# Patient Record
Sex: Male | Born: 1985 | Race: Black or African American | Hispanic: No | Marital: Single | Smoking: Former smoker
Health system: Southern US, Community
[De-identification: ages and names within clinical notes are randomized; demographics above are authoritative.]

## PROBLEM LIST (undated history)

## (undated) DIAGNOSIS — G43909 Migraine, unspecified, not intractable, without status migrainosus: Secondary | ICD-10-CM

## (undated) DIAGNOSIS — J189 Pneumonia, unspecified organism: Secondary | ICD-10-CM

---
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
## 2006-04-21 ENCOUNTER — Emergency Department (HOSPITAL_COMMUNITY): Admission: EM | Admit: 2006-04-21 | Discharge: 2006-04-21 | Payer: Self-pay | Admitting: Family Medicine

## 2009-01-27 ENCOUNTER — Emergency Department (HOSPITAL_COMMUNITY): Admission: EM | Admit: 2009-01-27 | Discharge: 2009-01-27 | Payer: Self-pay | Admitting: Emergency Medicine

## 2009-01-29 ENCOUNTER — Emergency Department (HOSPITAL_COMMUNITY): Admission: EM | Admit: 2009-01-29 | Discharge: 2009-01-29 | Payer: Self-pay | Admitting: Emergency Medicine

## 2009-02-15 ENCOUNTER — Emergency Department (HOSPITAL_COMMUNITY): Admission: EM | Admit: 2009-02-15 | Discharge: 2009-02-15 | Payer: Self-pay | Admitting: Emergency Medicine

## 2010-03-11 ENCOUNTER — Emergency Department (HOSPITAL_COMMUNITY): Admission: EM | Admit: 2010-03-11 | Discharge: 2010-03-11 | Payer: Self-pay | Admitting: Emergency Medicine

## 2010-12-13 ENCOUNTER — Emergency Department (HOSPITAL_COMMUNITY)
Admission: EM | Admit: 2010-12-13 | Discharge: 2010-12-13 | Payer: Self-pay | Source: Home / Self Care | Admitting: Emergency Medicine

## 2010-12-14 ENCOUNTER — Emergency Department (HOSPITAL_COMMUNITY)
Admission: EM | Admit: 2010-12-14 | Discharge: 2010-12-14 | Payer: Self-pay | Source: Home / Self Care | Admitting: Emergency Medicine

## 2010-12-25 ENCOUNTER — Emergency Department (HOSPITAL_COMMUNITY)
Admission: EM | Admit: 2010-12-25 | Discharge: 2010-12-25 | Disposition: A | Discharge status: Home / Self Care | Payer: Self-pay | Attending: Emergency Medicine | Admitting: Emergency Medicine

## 2010-12-25 DIAGNOSIS — R454 Irritability and anger: Secondary | ICD-10-CM | POA: Insufficient documentation

## 2010-12-25 DIAGNOSIS — I498 Other specified cardiac arrhythmias: Secondary | ICD-10-CM | POA: Insufficient documentation

## 2010-12-25 DIAGNOSIS — G43909 Migraine, unspecified, not intractable, without status migrainosus: Secondary | ICD-10-CM | POA: Insufficient documentation

## 2010-12-25 DIAGNOSIS — R259 Unspecified abnormal involuntary movements: Secondary | ICD-10-CM | POA: Insufficient documentation

## 2011-03-09 ENCOUNTER — Emergency Department (HOSPITAL_COMMUNITY)
Admission: EM | Admit: 2011-03-09 | Discharge: 2011-03-09 | Disposition: A | Discharge status: Home / Self Care | Payer: No Typology Code available for payment source | Attending: Emergency Medicine | Admitting: Emergency Medicine

## 2011-03-09 DIAGNOSIS — S335XXA Sprain of ligaments of lumbar spine, initial encounter: Secondary | ICD-10-CM | POA: Insufficient documentation

## 2011-03-09 DIAGNOSIS — Y9289 Other specified places as the place of occurrence of the external cause: Secondary | ICD-10-CM | POA: Insufficient documentation

## 2011-11-23 ENCOUNTER — Encounter: Payer: Self-pay | Admitting: Emergency Medicine

## 2011-11-23 ENCOUNTER — Emergency Department (HOSPITAL_COMMUNITY)
Admission: EM | Admit: 2011-11-23 | Discharge: 2011-11-23 | Discharge status: Against Medical Advice | Payer: Self-pay | Attending: Emergency Medicine | Admitting: Emergency Medicine

## 2011-11-23 DIAGNOSIS — M79609 Pain in unspecified limb: Secondary | ICD-10-CM | POA: Insufficient documentation

## 2011-11-23 NOTE — ED Notes (Signed)
Pt c/o left hand pain after horse playing yesterday; pt sts painful to move

## 2012-06-13 ENCOUNTER — Encounter (HOSPITAL_COMMUNITY): Payer: Self-pay | Admitting: *Deleted

## 2012-06-13 ENCOUNTER — Emergency Department (HOSPITAL_COMMUNITY)
Admission: EM | Admit: 2012-06-13 | Discharge: 2012-06-13 | Disposition: A | Discharge status: Home / Self Care | Payer: Self-pay | Attending: Emergency Medicine | Admitting: Emergency Medicine

## 2012-06-13 DIAGNOSIS — J3489 Other specified disorders of nose and nasal sinuses: Secondary | ICD-10-CM | POA: Insufficient documentation

## 2012-06-13 HISTORY — DX: Migraine, unspecified, not intractable, without status migrainosus: G43.909

## 2012-06-13 NOTE — ED Notes (Signed)
Pt is here with sinus problems with icepack to head and state that he gets sick on his stomach. Pt reports history of migraines and wants a dark room.  Pt reports hurting for couple of hours

## 2012-06-13 NOTE — ED Notes (Signed)
Called and no response times 2

## 2012-06-13 NOTE — ED Notes (Signed)
Pt did not answer x 3 

## 2013-03-24 ENCOUNTER — Emergency Department (HOSPITAL_COMMUNITY)
Admission: EM | Admit: 2013-03-24 | Discharge: 2013-03-24 | Disposition: A | Discharge status: Home / Self Care | Payer: Self-pay | Attending: Emergency Medicine | Admitting: Emergency Medicine

## 2013-03-24 ENCOUNTER — Encounter (HOSPITAL_COMMUNITY): Payer: Self-pay | Admitting: *Deleted

## 2013-03-24 DIAGNOSIS — R51 Headache: Secondary | ICD-10-CM | POA: Insufficient documentation

## 2013-03-24 DIAGNOSIS — F17200 Nicotine dependence, unspecified, uncomplicated: Secondary | ICD-10-CM | POA: Insufficient documentation

## 2013-03-24 MED ORDER — METOCLOPRAMIDE HCL 5 MG/ML IJ SOLN
10.0000 mg | Freq: Once | INTRAMUSCULAR | Status: AC
  Administered 2013-03-24: 10 mg via INTRAVENOUS
  Filled 2013-03-24: qty 2

## 2013-03-24 MED ORDER — LORAZEPAM 2 MG/ML IJ SOLN
1.0000 mg | Freq: Once | INTRAMUSCULAR | Status: AC
  Administered 2013-03-24: 1 mg via INTRAVENOUS
  Filled 2013-03-24: qty 1

## 2013-03-24 MED ORDER — DIPHENHYDRAMINE HCL 50 MG/ML IJ SOLN
25.0000 mg | Freq: Once | INTRAMUSCULAR | Status: AC
  Administered 2013-03-24: 25 mg via INTRAVENOUS

## 2013-03-24 MED ORDER — DIPHENHYDRAMINE HCL 50 MG/ML IJ SOLN
INTRAMUSCULAR | Status: AC
  Filled 2013-03-24: qty 1

## 2013-03-24 MED ORDER — KETOROLAC TROMETHAMINE 30 MG/ML IJ SOLN
30.0000 mg | Freq: Once | INTRAMUSCULAR | Status: AC
  Administered 2013-03-24: 30 mg via INTRAVENOUS
  Filled 2013-03-24: qty 1

## 2013-03-24 MED ORDER — DIPHENHYDRAMINE HCL 50 MG/ML IJ SOLN
25.0000 mg | Freq: Once | INTRAMUSCULAR | Status: AC
  Administered 2013-03-24: 25 mg via INTRAVENOUS
  Filled 2013-03-24: qty 1

## 2013-03-24 MED ORDER — SODIUM CHLORIDE 0.9 % IV BOLUS (SEPSIS)
1000.0000 mL | Freq: Once | INTRAVENOUS | Status: AC
  Administered 2013-03-24: 1000 mL via INTRAVENOUS

## 2013-03-24 MED ORDER — HYDROCODONE-ACETAMINOPHEN 5-325 MG PO TABS: 1.0000 | ORAL_TABLET | Freq: Four times a day (QID) | ORAL | Status: DC | PRN

## 2013-03-24 NOTE — ED Provider Notes (Signed)
History     CSN: [id number]  Arrival date & time 03/24/13  1633   First MD Initiated Contact with Patient 03/24/13 1853      Chief Complaint  Patient presents with  . Headache    (Consider location/radiation/quality/duration/timing/severity/associated sxs/prior treatment) The history is provided by the patient and a relative. No language interpreter was used.    Frank Rangel is a 27 y.o. male who complains of migraine headache for 1 day(s). He has a well established history of recurrent migraines. Description of pain: throbbing pain, UL on the right, associated sinus pain and pressure. Associated symptoms: congestion, dizziness, facial pain, light sensitivity, nausea, vomiting, sound sensitivity, and severe sinus pressure, blurry vision and pain. Patient's headaches recur and become worse with change of seasons. Patient has already taken tylenol for this headache without relief. Patient has been having these headaches since middle school. He has had several CT scans at the ED and all have been negative. He has never seen a neurologist or had lumbar puncture.  Denies stiff neck, neck pain, rash, or "thunderclap" onset.    Current Facility-Administered Medications  Medication Dose Route Frequency Provider Last Rate Last Dose  . diphenhydrAMINE (BENADRYL) injection 25 mg  25 mg Intravenous Once [employer], PA-C      . ketorolac (TORADOL) 30 MG/ML injection 30 mg  30 mg Intravenous Once Arthor Captain, PA-C      . metoCLOPramide (REGLAN) injection 10 mg  10 mg Intravenous Once [employer], PA-C      . sodium chloride 0.9 % bolus 1,000 mL  1,000 mL Intravenous Once Arthor Captain, PA-C       No current outpatient prescriptions on file.    There are no associated abnormal neurological symptoms such as TIA's, loss of balance, loss of vision or speech, numbness or weakness on review. Past neurological history: negative for stroke, MS, epilepsy, or brain tumor.     Past  Medical History  Diagnosis Date  . Migraines     History reviewed. No pertinent past surgical history.  History reviewed. No pertinent family history.  History  Substance Use Topics  . Smoking status: Current Every Day Smoker  . Smokeless tobacco: Not on file  . Alcohol Use: No      Review of Systems Ten systems reviewed and are negative for acute change, except as noted in the HPI.   Allergies  Review of patient's allergies indicates no known allergies.  Home Medications  No current outpatient prescriptions on file.  BP 147/83  Pulse 48  Temp(Src) 97.9 F (36.6 C) (Oral)  Resp 20  SpO2 100%  Physical Exam Physical Exam  Nursing note and vitals reviewed. Constitutional: He appears well-developed and well-nourished. Wet towel over eyes. Patient appears extremely uncomfortable. HENT:  Head: Normocephalic and atraumatic.  Eyes: Conjunctivae normal are normal. No scleral icterus. EOMI/ PERRLA Neck: Normal range of motion. Neck supple.  Cardiovascular: Normal rate, regular rhythm and normal heart sounds.   Pulmonary/Chest: Effort normal and breath sounds normal. No respiratory distress.  Abdominal: Soft. There is no tenderness.  Musculoskeletal: He exhibits no edema.  Neurological: He is alert. Speech is clear and goal oriented, follows commands Major Cranial nerves without deficit, no facial droop Normal strength in upper and lower extremities bilaterally including dorsiflexion and plantar flexion, strong and equal grip strength Sensation normal to light and sharp touch Moves extremities without ataxia, coordination intact Normal finger to nose and rapid alternating movements Skin: Skin is warm and dry. He  is not diaphoretic.  Psychiatric: His behavior is normal.    ED Course  Procedures (including critical care time)  Labs Reviewed - No data to display No results found.   1. Headache       MDM  7:54 PM BP 147/83  Pulse 48  Temp(Src) 97.9 F (36.6  C) (Oral)  Resp 20  SpO2 100% Patient with typical headache. Will begin with migraine cocktail .   8:56 PM Pt HA treated and improved while in ED.  Presentation is like pts typical HA and non concerning for [facility], ICH, Meningitis, or temporal arteritis. Pt is afebrile with no focal neuro deficits, nuchal rigidity, or change in vision. Pt is to follow up with neurology for headache eval. . Pt verbalizes understanding and is agreeable with plan to dc.      Arthor Captain, PA-C 03/25/13 [phone]

## 2013-03-24 NOTE — ED Notes (Addendum)
Pt in with headache x1 day, history of same, also nausea, denies vomiting, pt states these symptoms are typical for his migraines

## 2013-03-27 ENCOUNTER — Encounter (HOSPITAL_COMMUNITY): Payer: Self-pay

## 2013-03-27 ENCOUNTER — Emergency Department (HOSPITAL_COMMUNITY)
Admission: EM | Admit: 2013-03-27 | Discharge: 2013-03-27 | Disposition: A | Discharge status: Home / Self Care | Payer: Self-pay | Attending: Emergency Medicine | Admitting: Emergency Medicine

## 2013-03-27 DIAGNOSIS — R112 Nausea with vomiting, unspecified: Secondary | ICD-10-CM | POA: Insufficient documentation

## 2013-03-27 DIAGNOSIS — F17200 Nicotine dependence, unspecified, uncomplicated: Secondary | ICD-10-CM | POA: Insufficient documentation

## 2013-03-27 DIAGNOSIS — G43909 Migraine, unspecified, not intractable, without status migrainosus: Secondary | ICD-10-CM | POA: Insufficient documentation

## 2013-03-27 MED ORDER — DIPHENHYDRAMINE HCL 50 MG/ML IJ SOLN
25.0000 mg | Freq: Once | INTRAMUSCULAR | Status: AC
  Administered 2013-03-27: 25 mg via INTRAVENOUS
  Filled 2013-03-27: qty 1

## 2013-03-27 MED ORDER — SODIUM CHLORIDE 0.9 % IV BOLUS (SEPSIS)
1000.0000 mL | Freq: Once | INTRAVENOUS | Status: AC
  Administered 2013-03-27: 1000 mL via INTRAVENOUS

## 2013-03-27 MED ORDER — DEXAMETHASONE SODIUM PHOSPHATE 10 MG/ML IJ SOLN
10.0000 mg | Freq: Once | INTRAMUSCULAR | Status: AC
  Administered 2013-03-27: 10 mg via INTRAVENOUS
  Filled 2013-03-27: qty 1

## 2013-03-27 MED ORDER — FEXOFENADINE-PSEUDOEPHED ER 60-120 MG PO TB12: 1.0000 | ORAL_TABLET | Freq: Two times a day (BID) | ORAL | Status: DC

## 2013-03-27 NOTE — ED Provider Notes (Signed)
History     CSN: [id number]  Arrival date & time 03/27/13  1628   First MD Initiated Contact with Patient 03/27/13 1727      Chief Complaint  Patient presents with  . Migraine    (Consider location/radiation/quality/duration/timing/severity/associated sxs/prior treatment) HPI Comments: Patient presents with a migraine-type headache. He has a history of recurrent migraines. He says they typically get worse as the weather changing. He states he has about 3-4 migraines a week. He was recently seen Saturday for the same thing. He states his headache started about 11:30 this morning and has gradually gotten worse since then. He describes it as a pain in the left side of his head radiating to his left neck. He states this is the same type of pain he always has with his migraines. He feels some pressure in her sinuses but denies any sinus congestion or drainage. Denies any fevers or chills. He had some nausea and vomiting earlier. He denies any vision changes. He took a Vicodin earlier for the pain without relief.  Patient is a 27 y.o. male presenting with migraines.  Migraine Associated symptoms include headaches. Pertinent negatives include no chest pain, no abdominal pain and no shortness of breath.    Past Medical History  Diagnosis Date  . Migraines     History reviewed. No pertinent past surgical history.  History reviewed. No pertinent family history.  History  Substance Use Topics  . Smoking status: Current Every Day Smoker  . Smokeless tobacco: Not on file  . Alcohol Use: No      Review of Systems  Constitutional: Negative for fever, chills, diaphoresis and fatigue.  HENT: Negative for congestion, rhinorrhea and sneezing.   Eyes: Negative.   Respiratory: Negative for cough, chest tightness and shortness of breath.   Cardiovascular: Negative for chest pain and leg swelling.  Gastrointestinal: Positive for nausea and vomiting. Negative for abdominal pain, diarrhea and  blood in stool.  Genitourinary: Negative for frequency, hematuria, flank pain and difficulty urinating.  Musculoskeletal: Negative for back pain and arthralgias.  Skin: Negative for rash.  Neurological: Positive for headaches. Negative for dizziness, speech difficulty, weakness and numbness.    Allergies  Reglan  Home Medications   Current Outpatient Rx  Name  Route  Sig  Dispense  Refill  . HYDROcodone-acetaminophen (NORCO) 5-325 MG per tablet   Oral   Take 1-2 tablets by mouth every 6 (six) hours as needed for pain.   20 tablet   0   . Pseudoephedrine-Naproxen Na (ALEVE-D SINUS & HEADACHE) 120-220 MG TB12   Oral   Take 1 tablet by mouth 2 (two) times daily as needed (headaches).         . fexofenadine-pseudoephedrine (ALLEGRA-D) 60-120 MG per tablet   Oral   Take 1 tablet by mouth every 12 (twelve) hours.   30 tablet   0     BP 107/69  Pulse 46  Temp(Src) 98.1 F (36.7 C) (Oral)  Resp 16  SpO2 100%  Physical Exam  Constitutional: He is oriented to person, place, and time. He appears well-developed and well-nourished.  HENT:  Head: Normocephalic and atraumatic.  Eyes: Pupils are equal, round, and reactive to light.  Neck: Normal range of motion. Neck supple.  No meningeal signs  Cardiovascular: Normal rate, regular rhythm and normal heart sounds.   Pulmonary/Chest: Effort normal and breath sounds normal. No respiratory distress. He has no wheezes. He has no rales. He exhibits no tenderness.  Abdominal: Soft. Bowel sounds  are normal. There is no tenderness. There is no rebound and no guarding.  Musculoskeletal: Normal range of motion. He exhibits no edema.  Lymphadenopathy:    He has no cervical adenopathy.  Neurological: He is alert and oriented to person, place, and time. He has normal strength. No cranial nerve deficit or sensory deficit. GCS eye subscore is 4. GCS verbal subscore is 5. GCS motor subscore is 6.  Finger to nose intact  Skin: Skin is warm and  dry. No rash noted.  Psychiatric: He has a normal mood and affect.    ED Course  Procedures (including critical care time)  Labs Reviewed - No data to display No results found.   1. Migraine       MDM  Patient is feeling much better after migraine cocktail. His headache is completely resolved. His headaches are consistent with his past migraine-type headaches. There is no unusual symptoms or suggestions of subarachnoid hemorrhage or meningitis. He feels like he has some sinus component to his headaches I will prescribe him a trial of Allegra-D. He has no current sinus pressure or congestion. I did give him referral to go for a neurology per better management of this chronic headaches.        Rolan Bucco, MD 03/27/13 1902

## 2013-03-27 NOTE — ED Notes (Signed)
Pt c/o headache starting this am, seen here Saturday for the same, reports hx of migraines. Pt also c/o sensitivity to light

## 2013-03-27 NOTE — ED Notes (Signed)
Dr Beatris Ship aware pts HR upon d/c and verifies pt stable for d/c. Pt alert and mentating appropriately upon d/c. Pt leaving with d/c teaching and prescriptions. Pt verbalizes understanding of d/c teaching and has no further questions upon d/c. NAD noted upon d/c. Pt denies pain. Pt ambulatory upon d/c leaving with family. Pt instructed not to drive. Pt endorses he will not drive.

## 2013-04-09 NOTE — ED Provider Notes (Signed)
Medical screening examination/treatment/procedure(s) were performed by non-physician practitioner and as supervising physician I was immediately available for consultation/collaboration.   Hurman Horn, MD 04/09/13 [phone]

## 2013-12-27 ENCOUNTER — Encounter (HOSPITAL_COMMUNITY): Payer: Self-pay | Admitting: Emergency Medicine

## 2013-12-27 ENCOUNTER — Emergency Department (HOSPITAL_COMMUNITY)
Admission: EM | Admit: 2013-12-27 | Discharge: 2013-12-28 | Disposition: A | Discharge status: Home / Self Care | Payer: Self-pay | Attending: Emergency Medicine | Admitting: Emergency Medicine

## 2013-12-27 ENCOUNTER — Emergency Department (HOSPITAL_COMMUNITY): Payer: Self-pay

## 2013-12-27 DIAGNOSIS — Z8679 Personal history of other diseases of the circulatory system: Secondary | ICD-10-CM | POA: Insufficient documentation

## 2013-12-27 DIAGNOSIS — Y9372 Activity, wrestling: Secondary | ICD-10-CM | POA: Insufficient documentation

## 2013-12-27 DIAGNOSIS — S43016A Anterior dislocation of unspecified humerus, initial encounter: Secondary | ICD-10-CM | POA: Insufficient documentation

## 2013-12-27 DIAGNOSIS — Y929 Unspecified place or not applicable: Secondary | ICD-10-CM | POA: Insufficient documentation

## 2013-12-27 DIAGNOSIS — X500XXA Overexertion from strenuous movement or load, initial encounter: Secondary | ICD-10-CM | POA: Insufficient documentation

## 2013-12-27 DIAGNOSIS — S43015A Anterior dislocation of left humerus, initial encounter: Secondary | ICD-10-CM

## 2013-12-27 DIAGNOSIS — F17200 Nicotine dependence, unspecified, uncomplicated: Secondary | ICD-10-CM | POA: Insufficient documentation

## 2013-12-27 MED ORDER — OXYCODONE-ACETAMINOPHEN 5-325 MG PO TABS
1.0000 | ORAL_TABLET | Freq: Once | ORAL | Status: AC
  Administered 2013-12-27: 1 via ORAL
  Filled 2013-12-27: qty 1

## 2013-12-27 NOTE — ED Notes (Signed)
Pt. reports pain / injury to left shoulder while playing wrestling this evening , presents with swelling / deformity at left shoulder . Respirations unlabored.

## 2013-12-28 ENCOUNTER — Emergency Department (HOSPITAL_COMMUNITY): Payer: Self-pay

## 2013-12-28 MED ORDER — ETOMIDATE 2 MG/ML IV SOLN
INTRAVENOUS | Status: AC
  Filled 2013-12-28: qty 10

## 2013-12-28 MED ORDER — KETAMINE HCL 10 MG/ML IJ SOLN
1.0000 mg/kg | Freq: Once | INTRAMUSCULAR | Status: DC
  Filled 2013-12-28: qty 7.7

## 2013-12-28 MED ORDER — ETOMIDATE 2 MG/ML IV SOLN
INTRAVENOUS | Status: AC | PRN
  Administered 2013-12-28: 9 mg via INTRAVENOUS

## 2013-12-28 MED ORDER — ONDANSETRON HCL 4 MG/2ML IJ SOLN
4.0000 mg | Freq: Once | INTRAMUSCULAR | Status: AC
  Administered 2013-12-28: 4 mg via INTRAVENOUS
  Filled 2013-12-28: qty 2

## 2013-12-28 MED ORDER — SODIUM CHLORIDE 0.9 % IV SOLN
INTRAVENOUS | Status: DC
  Administered 2013-12-28: 1000 mL via INTRAVENOUS
  Administered 2013-12-28: 03:00:00 via INTRAVENOUS

## 2013-12-28 MED ORDER — HYDROCODONE-ACETAMINOPHEN 5-325 MG PO TABS: 2.0000 | ORAL_TABLET | ORAL | Status: DC | PRN

## 2013-12-28 MED ORDER — IBUPROFEN 800 MG PO TABS: 800.0000 mg | ORAL_TABLET | Freq: Three times a day (TID) | ORAL | Status: DC

## 2013-12-28 MED ORDER — FENTANYL CITRATE 0.05 MG/ML IJ SOLN
50.0000 ug | INTRAMUSCULAR | Status: DC | PRN
  Administered 2013-12-28 (×2): 50 ug via INTRAVENOUS
  Filled 2013-12-28 (×2): qty 2

## 2013-12-28 MED ORDER — KETAMINE HCL 10 MG/ML IJ SOLN
INTRAMUSCULAR | Status: AC | PRN
  Administered 2013-12-28 (×2): 77.1 mg via INTRAVENOUS

## 2013-12-28 NOTE — ED Notes (Signed)
Pt calmer, placed in prone position to attempt to facilitate continued reduction of left shoulder.

## 2013-12-28 NOTE — ED Notes (Signed)
Pt transported to xray 

## 2013-12-28 NOTE — ED Provider Notes (Signed)
CSN: [id number]     Arrival date & time 12/27/13  2240 History   First MD Initiated Contact with Patient 12/27/13 2354     Chief Complaint  Patient presents with  . Shoulder Injury     (Consider location/radiation/quality/duration/timing/severity/associated sxs/prior Treatment) HPI History per patient. Wrestling with a friend and injured his left shoulder, complains of dislocation with h/o same, severe sharp pain, unable to move it 2/2 pain. No weakness or numbness. No other injury, no radiation of pain. No LOC, neck pain, CP or SOB.   Past Medical History  Diagnosis Date  . Migraines    History reviewed. No pertinent past surgical history. No family history on file. History  Substance Use Topics  . Smoking status: Current Every Day Smoker  . Smokeless tobacco: Not on file  . Alcohol Use: No    Review of Systems  Constitutional: Negative for diaphoresis and fatigue.  Eyes: Negative for visual disturbance.  Respiratory: Negative for shortness of breath.   Cardiovascular: Negative for chest pain.  Gastrointestinal: Negative for abdominal pain.  Genitourinary: Negative for flank pain.  Musculoskeletal: Negative for back pain and neck pain.  Skin: Negative for rash.  Neurological: Negative for weakness and numbness.  All other systems reviewed and are negative.      Allergies  Reglan  Home Medications  No current outpatient prescriptions on file. BP 145/90  Pulse 60  Temp(Src) 98.3 F (36.8 C) (Oral)  Resp 26  Ht 5\' 8"  (1.727 m)  Wt 170 lb (77.111 kg)  BMI 25.85 kg/m2  SpO2 100% Physical Exam  Constitutional: He is oriented to person, place, and time. He appears well-developed and well-nourished.  HENT:  Head: Normocephalic and atraumatic.  Mouth/Throat: Oropharynx is clear and moist.  Uvula midline  Eyes: EOM are normal. Pupils are equal, round, and reactive to light.  Neck: Neck supple. No tracheal deviation present. No thyromegaly present.   Cardiovascular: Normal rate, regular rhythm and intact distal pulses.   Pulmonary/Chest: Effort normal and breath sounds normal. No stridor. No respiratory distress.  Abdominal: Soft. There is no tenderness.  Musculoskeletal:  LUE with shoulder deformity c/w dislocation, tenderness over anterior shoulder and dec ROM secondary to pain. No tenderness over clavicle, elbow or wrist with distal pulses, sensorium and motor intact  Neurological: He is alert and oriented to person, place, and time.  Skin: Skin is warm and dry.    ED Course  Procedural sedation Date/Time: 12/28/2013 3:32 AM Performed by: Sunnie Nielsen Authorized by: Sunnie Nielsen Consent: Verbal consent obtained. written consent obtained. Risks and benefits: risks, benefits and alternatives were discussed Consent given by: patient Patient understanding: patient states understanding of the procedure being performed Patient consent: the patient's understanding of the procedure matches consent given Procedure consent: procedure consent matches procedure scheduled Relevant documents: relevant documents present and verified Test results: test results available and properly labeled Imaging studies: imaging studies available Required items: required blood products, implants, devices, and special equipment available Patient identity confirmed: verbally with patient Time out: Immediately prior to procedure a "time out" was called to verify the correct patient, procedure, equipment, support staff and site/side marked as required. Patient sedated: yes Sedation type: moderate (conscious) sedation Sedatives: etomidate and see MAR for details Sedation start date/time: 12/28/2013 3:10 AM Sedation end date/time: 12/28/2013 3:32 AM Vitals: Vital signs were monitored during sedation. Patient tolerance: Patient tolerated the procedure well with no immediate complications.  Reduction of dislocation Date/Time: 12/28/2013 3:32 AM Performed by:  Sunnie Nielsen Authorized  by: Sunnie NielsenPITZ, Tionne Dayhoff Consent: Verbal consent obtained. written consent obtained. Risks and benefits: risks, benefits and alternatives were discussed Consent given by: patient Patient understanding: patient states understanding of the procedure being performed Patient consent: the patient's understanding of the procedure matches consent given Procedure consent: procedure consent matches procedure scheduled Relevant documents: relevant documents present and verified Imaging studies: imaging studies available Patient identity confirmed: verbally with patient Time out: Immediately prior to procedure a "time out" was called to verify the correct patient, procedure, equipment, support staff and site/side marked as required. Patient tolerance: Patient tolerated the procedure well with no immediate complications. Comments: Gentle traction applied tto LUE with reduction of dislocation   (including critical care time) Labs Review Labs Reviewed - No data to display Imaging Review Dg Shoulder Left  12/28/2013   CLINICAL DATA:  Shoulder dislocation.  Postreduction images.  EXAM: LEFT SHOULDER - 2+ VIEW  COMPARISON:  DG SHOULDER*L* dated 12/27/2013  FINDINGS: Based on the frontal and scapular Y-view, the reduction of the knee anterior shoulder dislocation appears successful. Similar to the prior exam, there does appear to be a Hill-Sachs fracture in the posterior humeral head. No large bony Bankart is identified. AC joint appears within normal limits.  IMPRESSION: Successful reduction of anterior left shoulder dislocation. Hill-Sachs fracture.   Electronically Signed   By: Andreas NewportGeoffrey  Lamke M.D.   On: 12/28/2013 04:02   Dg Shoulder Left  12/28/2013   CLINICAL DATA:  Wrestling.  Left shoulder pain.  EXAM: LEFT SHOULDER - 2+ VIEW  COMPARISON:  None.  FINDINGS: Anterior left shoulder dislocation is evident on frontal and scapular Y-views. There may be some rarefaction of bone in the greater  tuberosity suggesting a Hill-Sachs fracture.  IMPRESSION: Anterior shoulder dislocation.   Electronically Signed   By: Andreas NewportGeoffrey  Lamke M.D.   On: 12/28/2013 00:14    IV fentanyl and zofran provided  Initially attempted sedation with ketamine 2mg /kg IV - did not achieve adequate sedation and unable to reduce shoulder.  Attempted again with etomidate and successful reduction. Confirmed with xray. Sling provided. VS WNL. PT called for a ride home with a responsible adult.   Plan d/c home, follow up orthopedic surgeon, RX provided. Sedation and dislocation precautions provided.  MDM   Dx: L anterior shoulder dislocation  Sedation Shoulder reduction IV narcotics pain control IVFs provided Condition improved/ VS and nurses notes reviewed and considered    Sunnie NielsenBrian Kiron Osmun, MD 12/28/13 2344

## 2013-12-28 NOTE — Discharge Instructions (Signed)
Shoulder Dislocation Rest, wear sling as directed and followup with an orthopedic surgeon. Take medications as needed for discomfort.  Your shoulder is made up of three bones: the collar bone (clavicle); the shoulder blade (scapula), which includes the socket (glenoid cavity); and the upper arm bone (humerus). Your shoulder joint is the place where these bones meet. Strong, fibrous tissues hold these bones together (ligaments). Muscles and strong, fibrous tissues that connect the muscles to these bones (tendons) allow your arm to move through this joint. The range of motion of your shoulder joint is more extensive than most of your other joints, and the glenoid cavity is very shallow. That is the reason that your shoulder joint is one of the most unstable joints in your body. It is far more prone to dislocation than your other joints. Shoulder dislocation is when your humerus is forced out of your shoulder joint. CAUSES Shoulder dislocation is caused by a forceful impact on your shoulder. This impact usually is from an injury, such as a sports injury or a fall. SYMPTOMS Symptoms of shoulder dislocation include:  Deformity of your shoulder.  Intense pain.  Inability to move your shoulder joint.  Numbness, weakness, or tingling around your shoulder joint (your neck or down your arm).  Bruising or swelling around your shoulder. DIAGNOSIS In order to diagnose a dislocated shoulder, your caregiver will perform a physical exam. Your caregiver also may have an X-ray exam done to see if you have any broken bones. Magnetic resonance imaging (MRI) is a procedure that sometimes is done to help your caregiver see any damage to the soft tissues around your shoulder, particularly your rotator cuff tendons. Additionally, your caregiver also may have electromyography done to measure the electrical discharges produced in your muscles if you have signs or symptoms of nerve damage. TREATMENT A shoulder dislocation  is treated by placing the humerus back in the joint (reduction). Your caregiver does this either manually (closed reduction), by moving your humerus back into the joint through manipulation, or through surgery (open reduction). When your humerus is back in place, severe pain should improve almost immediately. You also may need to have surgery if you have a weak shoulder joint or ligaments, and you have recurring shoulder dislocations, despite rehabilitation. In rare cases, surgery is necessary if your nerves or blood vessels are damaged during the dislocation. After your reduction, your arm will be placed in a shoulder immobilizer or sling to keep it from moving. Your caregiver will have you wear your shoulder immoblizer or sling for 3 days to 3 weeks, depending on how serious your dislocation is. When your shoulder immobilizer or sling is removed, your caregiver may prescribe physical therapy to help improve the range of motion in your shoulder joint. HOME CARE INSTRUCTIONS  The following measures can help to reduce pain and speed up the healing process:  Rest your injured joint. Do not move it. Avoid activities similar to the one that caused your injury.  Apply ice to your injured joint for the first day or two after your reduction or as directed by your caregiver. Applying ice helps to reduce inflammation and pain.  Put ice in a plastic bag.  Place a towel between your skin and the bag.  Leave the ice on for 15-20 minutes at a time, every 2 hours while you are awake.  Exercise your hand by squeezing a soft ball. This helps to eliminate stiffness and swelling in your hand and wrist.  Take over-the-counter or prescription  medicine for pain or discomfort as told by your caregiver. SEEK IMMEDIATE MEDICAL CARE IF:   Your shoulder immobilizer or sling becomes damaged.  Your pain becomes worse rather than better.  You lose feeling in your arm or hand, or they become white and cold. MAKE SURE  YOU:   Understand these instructions.  Will watch your condition.  Will get help right away if you are not doing well or get worse. Document Released: 07/27/2001 Document Revised: 01/24/2012 Document Reviewed: 08/22/2011 [facility] Patient Information 2014 [place], [place].

## 2013-12-28 NOTE — ED Notes (Signed)
Pt remains agitated, anxious, vital signs close to baseline. Pt alert x 3. Pt able to rotate arm full circle with some difficulty and pain.

## 2013-12-28 NOTE — ED Notes (Signed)
Pt is alert and oriented x4.  Pt is ambulatory, no assistive devices.  Pt states he will get a ride home from a friend and will not drive from ER.  Pt advise of the side effects of the sedation and pain medication administered.  Pt verbalized understanding at discharge.

## 2013-12-28 NOTE — ED Notes (Signed)
Pt more relaxed, calmer. Able to rotate arm full 360 degrees. States that arm still hurts when moving it. VSS

## 2013-12-28 NOTE — ED Notes (Addendum)
Pt remains agitated, stating that he's going crazy and about to have a panic attack.  Pt able to move left arm half way up.

## 2013-12-28 NOTE — ED Notes (Signed)
Dr. Dierdre Highmanpitz reduced left shoulder. Pt talking throughout procedure though confused. Pt agitated, stating that he feels like he's dying.

## 2014-04-27 ENCOUNTER — Emergency Department (HOSPITAL_COMMUNITY): Payer: Self-pay

## 2014-04-27 ENCOUNTER — Encounter (HOSPITAL_COMMUNITY): Payer: Self-pay | Admitting: Emergency Medicine

## 2014-04-27 ENCOUNTER — Emergency Department (HOSPITAL_COMMUNITY)
Admission: EM | Admit: 2014-04-27 | Discharge: 2014-04-27 | Disposition: A | Discharge status: Home / Self Care | Payer: Self-pay | Attending: Emergency Medicine | Admitting: Emergency Medicine

## 2014-04-27 DIAGNOSIS — Z79899 Other long term (current) drug therapy: Secondary | ICD-10-CM | POA: Insufficient documentation

## 2014-04-27 DIAGNOSIS — G43909 Migraine, unspecified, not intractable, without status migrainosus: Secondary | ICD-10-CM | POA: Insufficient documentation

## 2014-04-27 DIAGNOSIS — R000 Tachycardia, unspecified: Secondary | ICD-10-CM | POA: Insufficient documentation

## 2014-04-27 DIAGNOSIS — F17200 Nicotine dependence, unspecified, uncomplicated: Secondary | ICD-10-CM | POA: Insufficient documentation

## 2014-04-27 DIAGNOSIS — R079 Chest pain, unspecified: Secondary | ICD-10-CM | POA: Insufficient documentation

## 2014-04-27 DIAGNOSIS — Z8701 Personal history of pneumonia (recurrent): Secondary | ICD-10-CM | POA: Insufficient documentation

## 2014-04-27 HISTORY — DX: Pneumonia, unspecified organism: J18.9

## 2014-04-27 LAB — COMPREHENSIVE METABOLIC PANEL
ALT: 25 U/L (ref 0–53)
AST: 25 U/L (ref 0–37)
Albumin: 4.4 g/dL (ref 3.5–5.2)
Alkaline Phosphatase: 72 U/L (ref 39–117)
BUN: 14 mg/dL (ref 6–23)
CALCIUM: 10 mg/dL (ref 8.4–10.5)
CO2: 22 mEq/L (ref 19–32)
CREATININE: 0.89 mg/dL (ref 0.50–1.35)
Chloride: 101 mEq/L (ref 96–112)
GFR calc non Af Amer: >90 mL/min (ref >90)
GLUCOSE: 108 mg/dL — AB (ref 70–99)
Potassium: 4.2 mEq/L (ref 3.7–5.3)
SODIUM: 140 meq/L (ref 137–147)
TOTAL PROTEIN: 8.3 g/dL (ref 6.0–8.3)
Total Bilirubin: 0.5 mg/dL (ref 0.3–1.2)

## 2014-04-27 LAB — CBC WITH DIFFERENTIAL/PLATELET
BASOS ABS: 0 10*3/uL (ref 0.0–0.1)
Basophils Relative: 0 % (ref 0–1)
EOS ABS: 0.4 10*3/uL (ref 0.0–0.7)
EOS PCT: 4 % (ref 0–5)
HCT: 41.6 % (ref 39.0–52.0)
Hemoglobin: 14 g/dL (ref 13.0–17.0)
LYMPHS ABS: 1.6 10*3/uL (ref 0.7–4.0)
Lymphocytes Relative: 15 % (ref 12–46)
MCH: 27.3 pg (ref 26.0–34.0)
MCHC: 33.7 g/dL (ref 30.0–36.0)
MCV: 81.1 fL (ref 78.0–100.0)
MONO ABS: 1.1 10*3/uL — AB (ref 0.1–1.0)
Monocytes Relative: 11 % (ref 3–12)
Neutro Abs: 7 10*3/uL (ref 1.7–7.7)
Neutrophils Relative %: 70 % (ref 43–77)
PLATELETS: 391 10*3/uL (ref 150–400)
RBC: 5.13 MIL/uL (ref 4.22–5.81)
RDW: 15.2 % (ref 11.5–15.5)
WBC: 10.2 10*3/uL (ref 4.0–10.5)

## 2014-04-27 LAB — I-STAT TROPONIN, ED: TROPONIN I, POC: 0.01 ng/mL (ref 0.00–0.08)

## 2014-04-27 LAB — D-DIMER, QUANTITATIVE (NOT AT ARMC): D DIMER QUANT: 0.61 ug{FEU}/mL — AB (ref 0.00–0.48)

## 2014-04-27 LAB — RAPID URINE DRUG SCREEN, HOSP PERFORMED
AMPHETAMINES: NOT DETECTED
Barbiturates: NOT DETECTED
Benzodiazepines: NOT DETECTED
Cocaine: NOT DETECTED
OPIATES: NOT DETECTED
Tetrahydrocannabinol: POSITIVE — AB

## 2014-04-27 MED ORDER — ASPIRIN 81 MG PO CHEW
324.0000 mg | CHEWABLE_TABLET | Freq: Once | ORAL | Status: AC
  Administered 2014-04-27: 324 mg via ORAL
  Filled 2014-04-27: qty 4

## 2014-04-27 MED ORDER — IBUPROFEN 200 MG PO TABS
600.0000 mg | ORAL_TABLET | Freq: Once | ORAL | Status: AC
  Administered 2014-04-27: 600 mg via ORAL
  Filled 2014-04-27: qty 3

## 2014-04-27 MED ORDER — NAPROXEN 500 MG PO TABS: 500.0000 mg | ORAL_TABLET | Freq: Two times a day (BID) | ORAL | Status: DC

## 2014-04-27 MED ORDER — ONDANSETRON 4 MG PO TBDP
4.0000 mg | ORAL_TABLET | Freq: Once | ORAL | Status: AC
  Administered 2014-04-27: 4 mg via ORAL
  Filled 2014-04-27: qty 1

## 2014-04-27 MED ORDER — IOHEXOL 350 MG/ML SOLN
80.0000 mL | Freq: Once | INTRAVENOUS | Status: AC | PRN
  Administered 2014-04-27: 80 mL via INTRAVENOUS

## 2014-04-27 NOTE — ED Provider Notes (Signed)
CSN: [id number]     Arrival date & time 04/27/14  0900 History   First MD Initiated Contact with Patient 04/27/14 0910     Chief Complaint  Patient presents with  . Chest Pain     (Consider location/radiation/quality/duration/timing/severity/associated sxs/prior Treatment) The history is provided by the patient and medical records.   This is a 28 y.o. M with PMH significant for migraines, presenting to the ED for chest pain over the past week.  Pain localized to his midsternal region, described as an intermittent, sharp, stabbing sensation that occurs with leaning forward, twisting, and deep breathing.  Denies pain when lying still.  Denies any shortness of breath, palpitations, dizziness, pain of upper extremities or neck, numbness, paresthesias, or weakness.  Pt is a daily smoker-- only 3-4 cigs/day.  Admits to occasional marijuana use, denies IV drug use.  No prior cardiac hx.  No hx of DVT or PE.  No recent travel, LE edema, calf pain, recent surgeries, or prolonged immobilization.  States late [facility] April 2015 he was hospitalized for 8 days at [facility] for CAP which required 3 days of intubation.  States he has never felt that he returned to his "normal strength" after this.  Mild tachycardia on arrival, VS otherwise stable.  Pt is followed by pulmonology-- missed his recent appt on 04/12/14.  Past Medical History  Diagnosis Date  . Migraines   . Pneumonia    History reviewed. No pertinent past surgical history. No family history on file. History  Substance Use Topics  . Smoking status: Current Every Day Smoker  . Smokeless tobacco: Not on file  . Alcohol Use: No    Review of Systems  Cardiovascular: Positive for chest pain.  All other systems reviewed and are negative.   Allergies  Reglan  Home Medications   Prior to Admission medications   Medication Sig Start Date End Date Taking? Authorizing Provider  acetaminophen (TYLENOL) 325 MG tablet Take  650 mg by mouth every 6 (six) hours as needed for mild pain, moderate pain or fever.   Yes Historical Provider, MD  amLODipine (NORVASC) 10 MG tablet Take 10 mg by mouth daily.   Yes Historical Provider, MD   BP 144/86  Pulse 110  Temp(Src) 97.5 F (36.4 C) (Oral)  Resp 17  Wt 165 lb (74.844 kg)  SpO2 100%  Physical Exam  Nursing note and vitals reviewed. Constitutional: He is oriented to person, place, and time. He appears well-developed and well-nourished. No distress.  HENT:  Head: Normocephalic and atraumatic.  Mouth/Throat: Oropharynx is clear and moist.  Eyes: Conjunctivae and EOM are normal. Pupils are equal, round, and reactive to light.  Neck: Normal range of motion. Neck supple.  Cardiovascular: Normal rate, regular rhythm and normal heart sounds.   Pulmonary/Chest: Effort normal and breath sounds normal. No respiratory distress. He has no wheezes.  Musculoskeletal: Normal range of motion.  No calf asymmetry, tenderness, or palpable cords; no overlying erythema or warmth to touch; negative Homan's sign bilaterally  Neurological: He is alert and oriented to person, place, and time.  Skin: Skin is warm and dry. He is not diaphoretic.  Psychiatric: He has a normal mood and affect.    ED Course  Procedures (including critical care time) Labs Review Labs Reviewed  CBC WITH DIFFERENTIAL - Abnormal; Notable for the following:    Monocytes Absolute 1.1 (*)    All other components within normal limits  COMPREHENSIVE METABOLIC PANEL - Abnormal; Notable for the  following:    Glucose, Bld 108 (*)    All other components within normal limits  D-DIMER, QUANTITATIVE - Abnormal; Notable for the following:    D-Dimer, Quant 0.61 (*)    All other components within normal limits  URINE RAPID DRUG SCREEN (HOSP PERFORMED) - Abnormal; Notable for the following:    Tetrahydrocannabinol POSITIVE (*)    All other components within normal limits  I-STAT TROPOININ, ED    Imaging  Review Dg Chest 2 View  04/27/2014   CLINICAL DATA:  Right side chest pain  EXAM: CHEST  2 VIEW  COMPARISON:  None.  FINDINGS: Cardiomediastinal silhouette is unremarkable. No acute infiltrate or pleural effusion. No pulmonary edema. Bony thorax is unremarkable.  IMPRESSION: No active cardiopulmonary disease.   Electronically Signed   By: Natasha MeadLiviu  Pop M.D.   On: 04/27/2014 10:27   Ct Angio Chest Pe W/cm &/or Wo Cm  04/27/2014   CLINICAL DATA:  Chest pain with inspiration and elevated D-dimer levels. Recent hospitalization for pneumonia. Question pulmonary embolism.  EXAM: CT ANGIOGRAPHY CHEST WITH CONTRAST  TECHNIQUE: Multidetector CT imaging of the chest was performed using the standard protocol during bolus administration of intravenous contrast. Multiplanar CT image reconstructions and MIPs were obtained to evaluate the vascular anatomy.  CONTRAST:  80mL OMNIPAQUE IOHEXOL 350 MG/ML SOLN  COMPARISON:  Radiographs 04/27/2014.  FINDINGS: The pulmonary arteries are well opacified with contrast. There is no evidence of acute pulmonary embolism. The thoracic aorta and great vessels appear normal.  There is no evidence of mediastinal hematoma or lymphadenopathy. There is no significant pleural or pericardial effusion. A small hiatal hernia is noted.  The lungs are clear.  The visualized upper abdomen appears unremarkable aside from minimal reflux of contrasted blood into the hepatic veins. There are no worrisome osseous findings.  Review of the MIP images confirms the above findings.  IMPRESSION: No evidence of acute pulmonary embolism or other acute chest process.   Electronically Signed   By: Roxy HorsemanBill  Veazey M.D.   On: 04/27/2014 13:06     EKG Interpretation   Date/Time:  Saturday April 27 2014 09:06:29 EDT Ventricular Rate:  103 PR Interval:  158 QRS Duration: 86 QT Interval:  326 QTC Calculation: 427 R Axis:   71 Text Interpretation:  Sinus tachycardia ST elevation, consider early  repolarization,  pericarditis, or injury Nonspecific ST and T wave  abnormality Abnormal ECG No reciprocal changes to suggest STEMI Confirmed  by GOLDSTON  MD, SCOTT ([id number]) on 04/27/2014 9:37:53 AM      MDM   Final diagnoses:  Chest pain   28 year old male presenting to the ED for positional chest pain over the past week. He admits to weeklong hospitalization and [facility] late March-early April, however there is no record of this.  On exam, he is afebrile and overall nontoxic appearing. His chest pain is reproducible with bending forward to touch his toes.  He has no respiratory distress. Vital signs with a mild tachycardia, otherwise unremarkable.  Will obtain EKG, labs including d-dimer, CXR.  ASA given.  EKG sinus tach with findings consistent with mild pericarditis.  Troponin is negative. Chest x-ray is clear. Mildly elevated d-dimer at 0.61. CT ED was obtained which is negative for dissection/pulmonary embolus.  Patient states pain has improved significantly after ASA alone.  Pain atypical for ACS, likely due to pericarditis.  Will start on anti-inflammatories.  He will FU with his pulmonologist-- advised to reschedule his missed appt from May.  Discussed plan with patient, he/she acknowledged understanding and agreed with plan of care.  Return precautions given for new or worsening symptoms.  Discussed with Dr. Criss AlvineGoldston who agrees with assessment and plan of care.  Garlon HatchetLisa M Cheston Coury, PA-C 04/27/14 1515

## 2014-04-27 NOTE — ED Notes (Signed)
Patient transported to X-ray 

## 2014-04-27 NOTE — ED Notes (Signed)
PT refused wheelchair 

## 2014-04-27 NOTE — ED Notes (Signed)
Pt. Stated, I started having CP for a week, and I was in here for a week in April with pneumonia

## 2014-04-27 NOTE — Discharge Instructions (Signed)
Take the prescribed medication as directed. Follow-up with your pulmonologist-- make sure to re-schedule your missed appt. Return to the ED for new or worsening symptoms.

## 2014-04-27 NOTE — ED Notes (Signed)
PT nervous b/c of being hospitalized last month with pneumonia and intubated for 3 days. Explained CT angio to PT and answered questions. PT nervous, but less so after talking to him.

## 2014-04-27 NOTE — ED Notes (Signed)
MD at bedside. Lisa PA 

## 2014-04-27 NOTE — ED Notes (Signed)
PT stated he was moderately nauseous and wanted something for nausea, but not by IV

## 2014-04-28 NOTE — ED Provider Notes (Signed)
Medical screening examination/treatment/procedure(s) were conducted as a shared visit with non-physician practitioner(s) and myself.  I personally evaluated the patient during the encounter.   EKG Interpretation   Date/Time:  Saturday April 27 2014 13:48:42 EDT Ventricular Rate:  67 PR Interval:  171 QRS Duration: 74 QT Interval:  385 QTC Calculation: 406 R Axis:   60 Text Interpretation:  Sinus rhythm ST elevation consistent with early  repol or pericarditis no reciprocal changes Confirmed by Shreya Lacasse  MD,  Cordarro Spinnato ([id number]) on 04/27/2014 1:53:02 PM       Patient with chest pain x 1 week, possibly related to pericarditis given his symptoms. No signs of ACS, no PE on CTA. Will treat with NSAIDs and discussed return precautions.  Audree CamelScott T Fed Ceci, MD 04/28/14 [phone]

## 2015-12-21 IMAGING — CR DG CHEST 2V
2 series · 2 of 2 positions shown · non-contrast
Comparison: None.

CLINICAL DATA: Right side chest pain

EXAM:
CHEST  2 VIEW

[w chest pa]
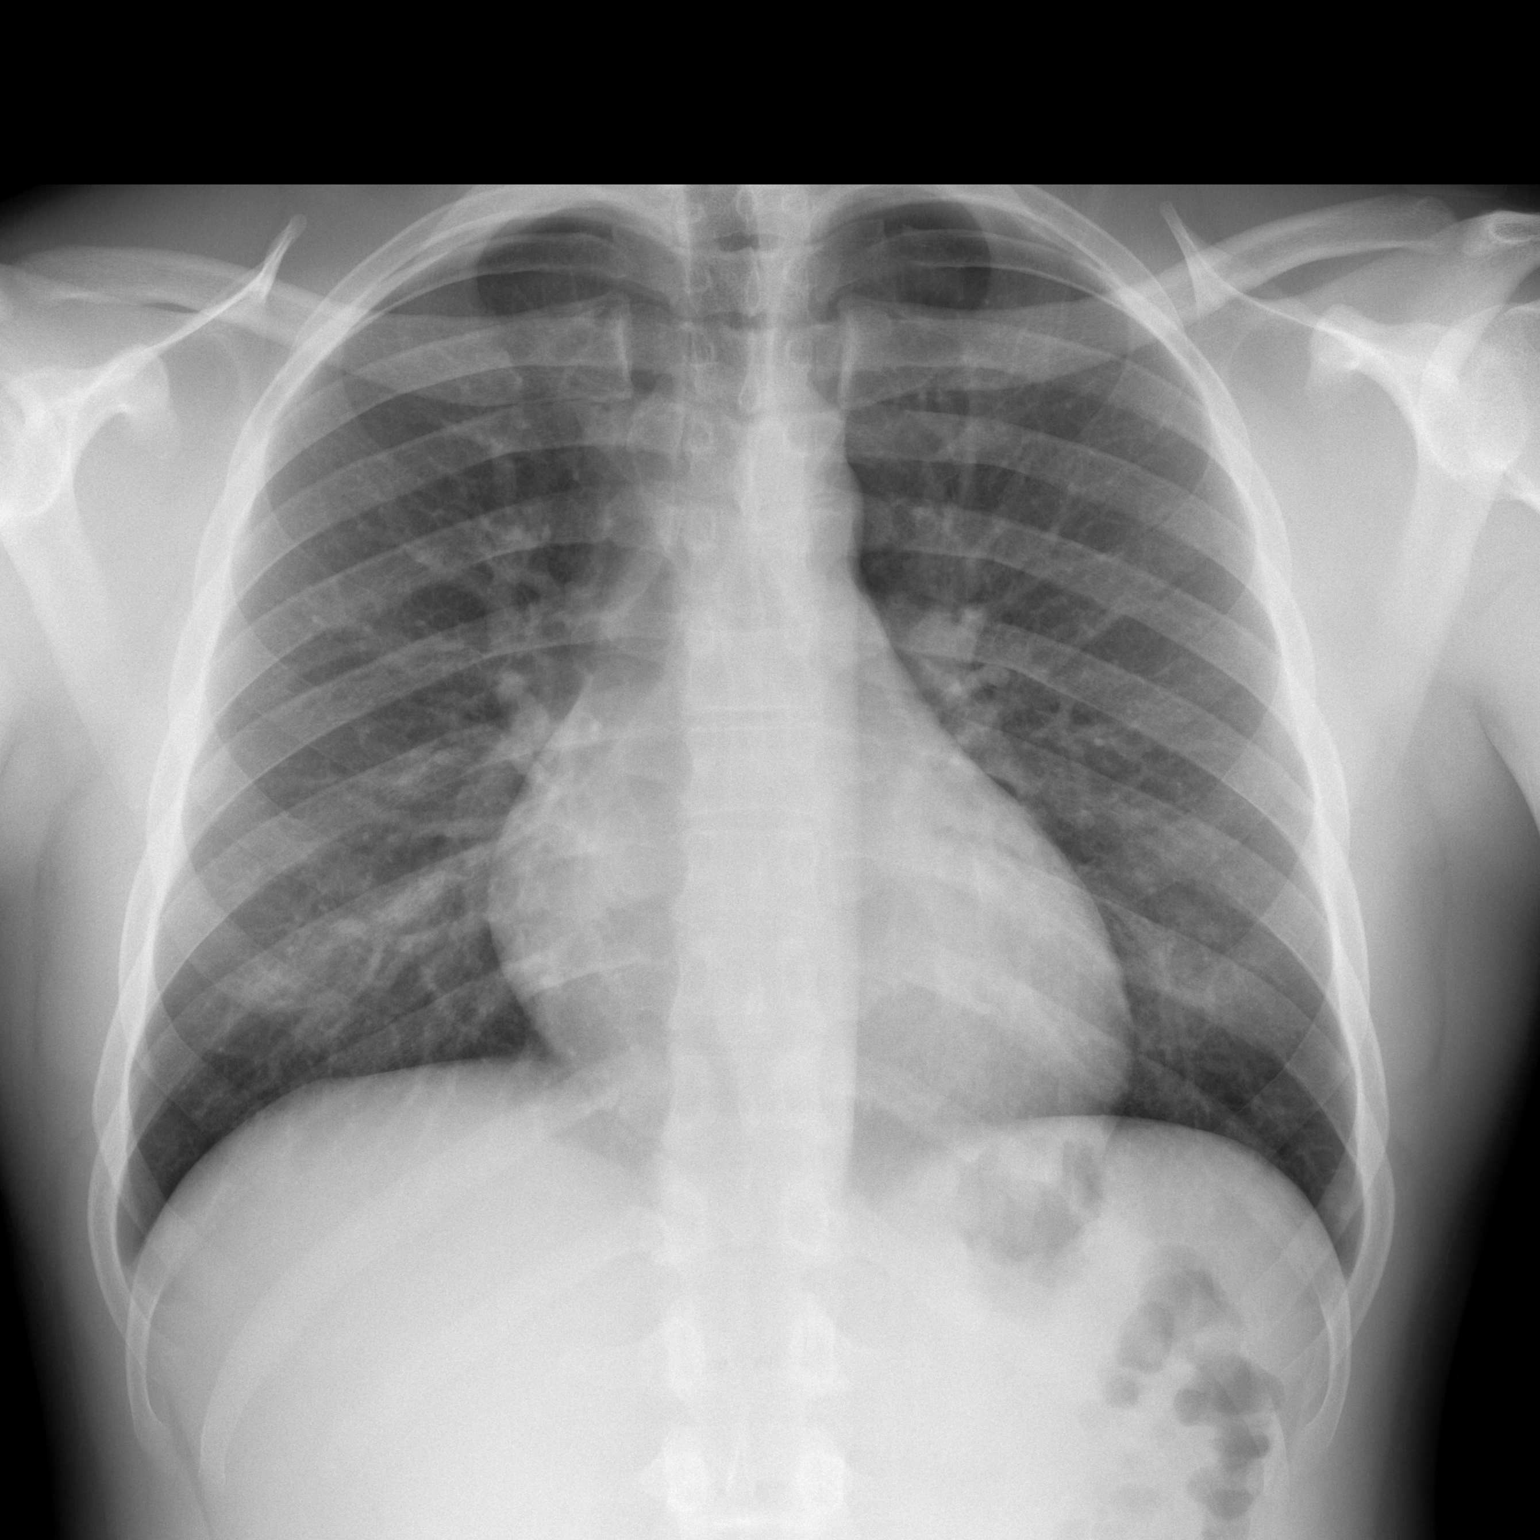

[w chest lat]
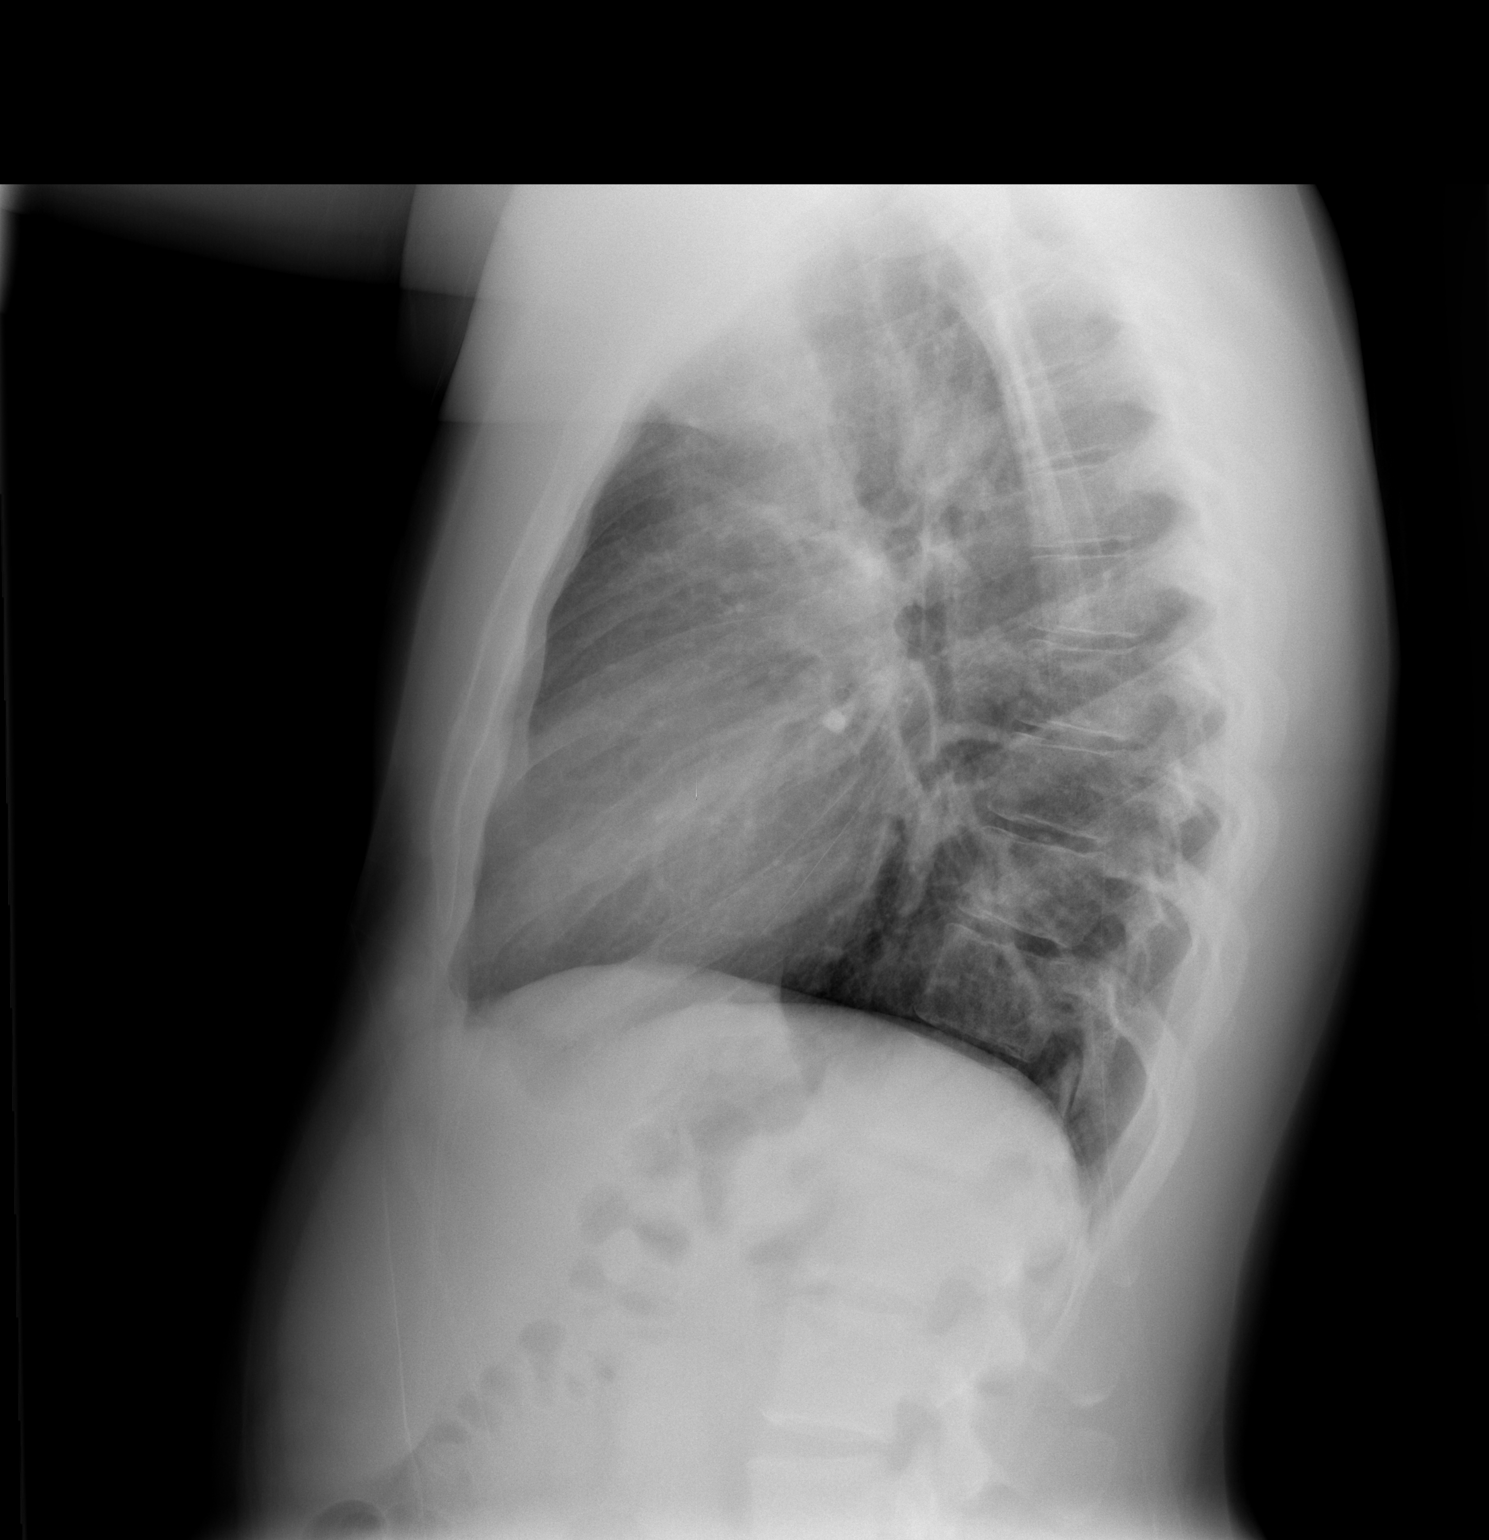

[2 of 2 positions shown; findings below may reference images not displayed]

FINDINGS: Cardiomediastinal silhouette is unremarkable. No acute infiltrate or
pleural effusion. No pulmonary edema. Bony thorax is unremarkable.
IMPRESSION: No active cardiopulmonary disease.

## 2016-02-09 ENCOUNTER — Emergency Department (HOSPITAL_COMMUNITY)
Admission: EM | Admit: 2016-02-09 | Discharge: 2016-02-09 | Disposition: A | Discharge status: Home / Self Care | Payer: No Typology Code available for payment source | Attending: Emergency Medicine | Admitting: Emergency Medicine

## 2016-02-09 ENCOUNTER — Encounter (HOSPITAL_COMMUNITY): Payer: Self-pay | Admitting: *Deleted

## 2016-02-09 DIAGNOSIS — Z791 Long term (current) use of non-steroidal anti-inflammatories (NSAID): Secondary | ICD-10-CM | POA: Insufficient documentation

## 2016-02-09 DIAGNOSIS — H00011 Hordeolum externum right upper eyelid: Secondary | ICD-10-CM | POA: Insufficient documentation

## 2016-02-09 DIAGNOSIS — F17200 Nicotine dependence, unspecified, uncomplicated: Secondary | ICD-10-CM | POA: Insufficient documentation

## 2016-02-09 DIAGNOSIS — H00013 Hordeolum externum right eye, unspecified eyelid: Secondary | ICD-10-CM

## 2016-02-09 DIAGNOSIS — Z79899 Other long term (current) drug therapy: Secondary | ICD-10-CM | POA: Insufficient documentation

## 2016-02-09 DIAGNOSIS — G43909 Migraine, unspecified, not intractable, without status migrainosus: Secondary | ICD-10-CM | POA: Insufficient documentation

## 2016-02-09 DIAGNOSIS — Z8701 Personal history of pneumonia (recurrent): Secondary | ICD-10-CM | POA: Insufficient documentation

## 2016-02-09 MED ORDER — FLUORESCEIN SODIUM 1 MG OP STRP
1.0000 | ORAL_STRIP | Freq: Once | OPHTHALMIC | Status: AC
  Administered 2016-02-09: 1 via OPHTHALMIC
  Filled 2016-02-09: qty 1

## 2016-02-09 MED ORDER — TETRACAINE HCL 0.5 % OP SOLN
2.0000 [drp] | Freq: Once | OPHTHALMIC | Status: AC
  Administered 2016-02-09: 2 [drp] via OPHTHALMIC
  Filled 2016-02-09: qty 2

## 2016-02-09 NOTE — ED Notes (Signed)
Declined W/C at D/C and was escorted to lobby by RN. 

## 2016-02-09 NOTE — Discharge Instructions (Signed)
Use warm compresses on her right eye at least 4 times a day. Stye A stye is a bump on your eyelid caused by a bacterial infection. A stye can form inside the eyelid (internal stye) or outside the eyelid (external stye). An internal stye may be caused by an infected oil-producing gland inside your eyelid. An external stye may be caused by an infection at the base of your eyelash (hair follicle). Styes are very common. Anyone can get them at any age. They usually occur in just one eye, but you may have more than one in either eye.  CAUSES  The infection is almost always caused by bacteria called Staphylococcus aureus. This is a common type of bacteria that lives on your skin. RISK FACTORS You may be at higher risk for a stye if you have had one before. You may also be at higher risk if you have:  Diabetes.  Long-term illness.  Long-term eye redness.  A skin condition called seborrhea.  High fat levels in your blood (lipids). SIGNS AND SYMPTOMS  Eyelid pain is the most common symptom of a stye. Internal styes are more painful than external styes. Other signs and symptoms may include:  Painful swelling of your eyelid.  A scratchy feeling in your eye.  Tearing and redness of your eye.  Pus draining from the stye. DIAGNOSIS  Your health care provider may be able to diagnose a stye just by examining your eye. The health care provider may also check to make sure:  You do not have a fever or other signs of a more serious infection.  The infection has not spread to other parts of your eye or areas around your eye. TREATMENT  Most styes will clear up in a few days without treatment. In some cases, you may need to use antibiotic drops or ointment to prevent infection. Your health care provider may have to drain the stye surgically if your stye is:  Large.  Causing a lot of pain.  Interfering with your vision. This can be done using a thin blade or a needle.  HOME CARE INSTRUCTIONS    Take medicines only as directed by your health care provider.  Apply a clean, warm compress to your eye for 10 minutes, 4 times a day.  Do not wear contact lenses or eye makeup until your stye has healed.  Do not try to pop or drain the stye. SEEK MEDICAL CARE IF:  You have chills or a fever.  Your stye does not go away after several days.  Your stye affects your vision.  Your eyeball becomes swollen, red, or painful. MAKE SURE YOU:  Understand these instructions.  Will watch your condition.  Will get help right away if you are not doing well or get worse.   This information is not intended to replace advice given to you by your health care provider. Make sure you discuss any questions you have with your health care provider.   Document Released: 08/11/2005 Document Revised: 11/22/2014 Document Reviewed: 02/15/2014 Elsevier Interactive Patient Education [employer]2016 [employer] Inc[employer]

## 2016-02-09 NOTE — ED Provider Notes (Signed)
CSN: [id number]     Arrival date & time 02/09/16  0910 History  By signing my name below, I, Frank Rangel, attest that this documentation has been prepared under the direction and in the presence of Frank FarrierWilliam Archimedes Harold, PA-C. Electronically Signed: Tanda RockersMargaux Rangel, ED Scribe. 02/09/2016. 10:25 AM.   Chief Complaint  Patient presents with  . Eye Problem   The history is provided by the patient. No language interpreter was used.     HPI Comments: Frank Rangel is a 30 y.o. male who presents to the Emergency Department complaining of gradual onset, constant, right lower inner eyelid pain and swelling x 1 days. Pt denies any pain to the eye itself. He does not wear contacts or glasses. He denies double vision, blurry vision, fever, sore throat, difficulty swallowing, or any other associated symptoms.    Past Medical History  Diagnosis Date  . Migraines   . Pneumonia    History reviewed. No pertinent past surgical history. History reviewed. No pertinent family history. Social History  Substance Use Topics  . Smoking status: Current Every Day Smoker  . Smokeless tobacco: None  . Alcohol Use: No    Review of Systems  Constitutional: Negative for fever.  HENT: Negative for sore throat and trouble swallowing.   Eyes: Positive for itching. Negative for photophobia, pain and visual disturbance.       + Right upper inner eyelid pain and swelling  Skin: Negative for rash.   Allergies  Reglan  Home Medications   Prior to Admission medications   Medication Sig Start Date End Date Taking? Authorizing Provider  acetaminophen (TYLENOL) 325 MG tablet Take 650 mg by mouth every 6 (six) hours as needed for mild pain, moderate pain or fever.    Historical Provider, MD  amLODipine (NORVASC) 10 MG tablet Take 10 mg by mouth daily.    Historical Provider, MD  naproxen (NAPROSYN) 500 MG tablet Take 1 tablet (500 mg total) by mouth 2 (two) times daily with a meal. 04/27/14   Garlon HatchetLisa M Sanders, PA-C    BP 140/97 mmHg  Pulse 50  Temp(Src) 98.3 F (36.8 C) (Oral)  Resp 16  SpO2 98%   Physical Exam  Constitutional: He is oriented to person, place, and time. He appears well-developed and well-nourished. No distress.  Nontoxic appearing.  HENT:  Head: Normocephalic and atraumatic.  Eyes: EOM are normal. Pupils are equal, round, and reactive to light. Right eye exhibits no discharge. Left eye exhibits no discharge. Right conjunctiva is injected (Mild right eye conjunctival injection. ).  Slit lamp exam:      The right eye shows no corneal abrasion, no corneal ulcer and no fluorescein uptake.  Very slight right upper eyelid swelling. Mild right conjunctival injection. No discharge. Evidence of hordeolum to right upper inner eyelid. Right eye was anesthetized with tetracaine and stained with fluorescein. No floor seen uptake. No corneal abrasions or ulcers. No foreign bodies noted. No Seidel's sign.  EOMs are intact bilaterally.  Neck: Normal range of motion.  Pulmonary/Chest: Effort normal. No respiratory distress.  Neurological: He is alert and oriented to person, place, and time. Coordination normal.  Skin: Skin is warm and dry. No rash noted. He is not diaphoretic. No erythema. No pallor.  Psychiatric: He has a normal mood and affect. His behavior is normal.  Nursing note and vitals reviewed.   ED Course  Procedures (including critical care time)  DIAGNOSTIC STUDIES: Oxygen Saturation is 98% on RA, normal by my interpretation.  COORDINATION OF CARE: 10:20 AM-Discussed treatment plan with pt at bedside and pt agreed to plan.    MDM   Meds given in ED:  Medications  tetracaine (PONTOCAINE) 0.5 % ophthalmic solution 2 drop (not administered)  fluorescein ophthalmic strip 1 strip (not administered)    New Prescriptions   No medications on file    Final diagnoses:  Hordeolum externum (stye), right    This is a 30 y.o. male who presents to the Emergency Department  complaining of gradual onset, constant, right lower inner eyelid pain and swelling x 1 days. Pt denies any pain to the eye itself. He does not wear contacts or glasses. Patient with hordeolum to the right eye. No floor seen uptake on exam. EOMs are intact. Vision is grossly intact. Patient educated on treatment of stye. I encouraged him to follow-up with ophthalmology if his eye issues persist. I advised the patient to follow-up with their primary care provider this week. I advised the patient to return to the emergency department with new or worsening symptoms or new concerns. The patient verbalized understanding and agreement with plan.    I personally performed the services described in this documentation, which was scribed in my presence. The recorded information has been reviewed and is accurate.       Frank Farrier, PA-C 02/09/16 1031  Arby Barrette, MD 02/11/16 1700

## 2016-02-09 NOTE — ED Notes (Signed)
Pt reports onset a few days ago to right eye redness and irritation. Denies injury.

## 2018-03-20 ENCOUNTER — Encounter (HOSPITAL_COMMUNITY): Payer: Self-pay

## 2018-03-20 ENCOUNTER — Emergency Department (HOSPITAL_COMMUNITY)
Admission: EM | Admit: 2018-03-20 | Discharge: 2018-03-20 | Disposition: A | Discharge status: Home / Self Care | Payer: Self-pay | Attending: Emergency Medicine | Admitting: Emergency Medicine

## 2018-03-20 ENCOUNTER — Other Ambulatory Visit: Payer: Self-pay

## 2018-03-20 DIAGNOSIS — R51 Headache: Secondary | ICD-10-CM | POA: Insufficient documentation

## 2018-03-20 DIAGNOSIS — J309 Allergic rhinitis, unspecified: Secondary | ICD-10-CM | POA: Insufficient documentation

## 2018-03-20 DIAGNOSIS — R0981 Nasal congestion: Secondary | ICD-10-CM | POA: Insufficient documentation

## 2018-03-20 DIAGNOSIS — K047 Periapical abscess without sinus: Secondary | ICD-10-CM | POA: Insufficient documentation

## 2018-03-20 MED ORDER — AMOXICILLIN-POT CLAVULANATE 875-125 MG PO TABS: 1.0000 | ORAL_TABLET | Freq: Two times a day (BID) | ORAL | 0 refills | Status: DC

## 2018-03-20 MED ORDER — LORATADINE 10 MG PO TABS: 10.0000 mg | ORAL_TABLET | Freq: Every day | ORAL | 0 refills | Status: AC

## 2018-03-20 MED ORDER — NAPROXEN 500 MG PO TABS: 500.0000 mg | ORAL_TABLET | Freq: Two times a day (BID) | ORAL | 0 refills | Status: AC

## 2018-03-20 MED ORDER — FLUTICASONE PROPIONATE 50 MCG/ACT NA SUSP: 1.0000 | Freq: Every day | NASAL | 0 refills | Status: AC

## 2018-03-20 NOTE — ED Notes (Signed)
Bed: WTR7 Expected date:  Expected time:  Means of arrival:  Comments: 

## 2018-03-20 NOTE — ED Triage Notes (Signed)
Patient c/o left lower dental pain x 2 days. Patient states he has been using salt water and H2O2 to rinse his mouth out rinse.  Patient c/o sinus pressure and headache x 2 days.

## 2018-03-20 NOTE — ED Provider Notes (Signed)
[facility]-EMERGENCY DEPT Provider Note   CSN: [id number] Arrival date & time: 03/20/18  1751     History   Chief Complaint Chief Complaint  Patient presents with  . Dental Pain  . sinus congestion  . Headache    HPI Frank Rangel is a 32 y.o. male presenting for evaluation of dental pain and sinus congestion.  Patient states that the past 2 days, he has had left lower tooth pain.  This improved mildly when he took to clindamycin this morning.  He denies significant swelling of the jaw, difficulty opening his mouth, difficulty swallowing, or pain under the tongue.  He denies fevers or chills.  He states that his tooth is cracked, but he has not seen a dentist about it.  This morning, patient reports nasal congestion and sinus pressure.  He states that this occurs almost every year.  He usually has to take Claritin to help, but has not taken any so far this year.  He denies ear pain, eye pain, cough, chest pain, shortness of breath, nausea, vomiting, abdominal pain.  He has no medical problems, does not take medications daily.  He quit smoking 5 years ago.  HPI  Past Medical History:  Diagnosis Date  . Migraines   . Pneumonia     There are no active problems to display for this patient.   History reviewed. No pertinent surgical history.      Home Medications    Prior to Admission medications   Medication Sig Start Date End Date Taking? Authorizing Provider  acetaminophen (TYLENOL) 325 MG tablet Take 650 mg by mouth every 6 (six) hours as needed for mild pain, moderate pain or fever.    [provider]  amLODipine (NORVASC) 10 MG tablet Take 10 mg by mouth daily.    [provider]  amoxicillin-clavulanate (AUGMENTIN) 875-125 MG tablet Take 1 tablet by mouth every 12 (twelve) hours. 03/20/18   Caliana Spires, PA-C  fluticasone (FLONASE) 50 MCG/ACT nasal spray Place 1 spray into both nostrils daily. 03/20/18   Gianah Batt, PA-C    loratadine (CLARITIN) 10 MG tablet Take 1 tablet (10 mg total) by mouth daily. 03/20/18   Alyviah Crandle, PA-C  naproxen (NAPROSYN) 500 MG tablet Take 1 tablet (500 mg total) by mouth 2 (two) times daily with a meal. 03/20/18   Khristopher Kapaun, PA-C    Family History History reviewed. No pertinent family history.  Social History Social History   Tobacco Use  . Smoking status: Former Games developer  . Smokeless tobacco: Never Used  Substance Use Topics  . Alcohol use: No  . Drug use: No     Allergies   Reglan [metoclopramide]   Review of Systems Review of Systems  Constitutional: Negative for fever.  HENT: Positive for congestion, dental problem, rhinorrhea, sinus pressure and sinus pain. Negative for ear pain, sore throat and trouble swallowing.   Respiratory: Negative for cough, chest tightness and shortness of breath.      Physical Exam Updated Vital Signs BP (!) 137/93 (BP Location: Left Arm)   Pulse 66   Temp 98.2 F (36.8 C) (Oral)   Resp 16   Ht  (1.727 m)   Wt 100.2 kg (221 lb)   SpO2 100%   BMI 33.60 kg/m   Physical Exam  Constitutional: He is oriented to person, place, and time. He appears well-developed and well-nourished. No distress.  Sitting comfortable in NAD  HENT:  Head: Normocephalic and atraumatic.  Right  Ear: Tympanic membrane, external ear and ear canal normal.  Left Ear: Tympanic membrane, external ear and ear canal normal.  Nose: Mucosal edema present. Right sinus exhibits frontal sinus tenderness. Right sinus exhibits no maxillary sinus tenderness. Left sinus exhibits frontal sinus tenderness. Left sinus exhibits no maxillary sinus tenderness.  Mouth/Throat: Uvula is midline, oropharynx is clear and moist and mucous membranes are normal. No oral lesions. No trismus in the jaw. Abnormal dentition. No uvula swelling. No tonsillar exudate.    Mild nasal dorsal edema.  OP clear without tonsillar swelling or exudate.  Uvula midline with equal  palate rise.  TMs nonerythematous and not bulging bilaterally.  Mild tenderness to palpation of frontal sinuses.  Cracked left lower tooth without obvious drainage.  Surrounding gum erythema and edema.  No palpable abscess.  No pain under the tongue.  No trismus.  Handling secretions easily.  Eyes: Pupils are equal, round, and reactive to light. Conjunctivae and EOM are normal.  Neck: Normal range of motion.  Cardiovascular: Normal rate, regular rhythm and intact distal pulses.  Pulmonary/Chest: Effort normal and breath sounds normal. He has no decreased breath sounds. He has no wheezes. He has no rhonchi. He has no rales.  Pt speaking in full sentences without difficulty.  Clear lung sounds in all fields  Abdominal: Soft. He exhibits no distension. There is no tenderness.  Musculoskeletal: Normal range of motion.  Lymphadenopathy:    He has no cervical adenopathy.  Neurological: He is alert and oriented to person, place, and time.  Skin: Skin is warm.  Psychiatric: He has a normal mood and affect.  Nursing note and vitals reviewed.    ED Treatments / Results  Labs (all labs ordered are listed, but only abnormal results are displayed) Labs Reviewed - No data to display  EKG None  Radiology No results found.  Procedures Procedures (including critical care time)  Medications Ordered in ED Medications - No data to display   Initial Impression / Assessment and Plan / ED Course  I have reviewed the triage vital signs and the nursing notes.  Pertinent labs & imaging results that were available during my care of the patient were reviewed by me and considered in my medical decision making (see chart for details).     Patient presenting for evaluation of dental pain and sinus congestion.  Physical exam reassuring, patient is afebrile not tachycardic.  He appears nontoxic.  No evidence for Ludwig's.  Sinus congestion appears consistent with allergic rhinitis.  History of seasonal  allergies.  Will treat sinus congestion with Flonase and Claritin.  Will treat possible dental infection with Augmentin and NSAIDs.  Follow-up with dentistry.  At this time, patient appears safe for discharge.  Return precautions given.  Patient states he understands and agrees to plan.  Final Clinical Impressions(s) / ED Diagnoses   Final diagnoses:  Dental infection  Allergic rhinitis, unspecified seasonality, unspecified trigger    ED Discharge Orders        Ordered    naproxen (NAPROSYN) 500 MG tablet  2 times daily with meals     03/20/18 1856    fluticasone (FLONASE) 50 MCG/ACT nasal spray  Daily     03/20/18 1856    amoxicillin-clavulanate (AUGMENTIN) 875-125 MG tablet  Every 12 hours     03/20/18 1856    loratadine (CLARITIN) 10 MG tablet  Daily     03/20/18 1856       Alveria Apley, PA-C 03/20/18 1913    Mora,  Cletis Athens, MD 03/22/18 8657

## 2018-03-20 NOTE — Discharge Instructions (Signed)
Take antibiotics as prescribed. Take the entire course, even if your symptoms improve.  Take naproxen 2 times a day with meals.  Do not take other anti-inflammatories at the same time open (Advil, Motrin, ibuprofen, Aleve). You may supplement with Tylenol if you need further pain control. Use Flonase daily for nasal congestion and sinus pressure. Take claritin daily. Follow up with a dentist.  Return to the emergency room if you develop high fevers, difficulty swallowing, difficulty opening your mouth, or any new or concerning symptoms.

## 2018-03-29 ENCOUNTER — Emergency Department (HOSPITAL_COMMUNITY)
Admission: EM | Admit: 2018-03-29 | Discharge: 2018-03-29 | Disposition: A | Discharge status: Home / Self Care | Payer: Self-pay | Attending: Emergency Medicine | Admitting: Emergency Medicine

## 2018-03-29 ENCOUNTER — Encounter (HOSPITAL_COMMUNITY): Payer: Self-pay | Admitting: Emergency Medicine

## 2018-03-29 DIAGNOSIS — R030 Elevated blood-pressure reading, without diagnosis of hypertension: Secondary | ICD-10-CM | POA: Insufficient documentation

## 2018-03-29 DIAGNOSIS — Z87891 Personal history of nicotine dependence: Secondary | ICD-10-CM | POA: Insufficient documentation

## 2018-03-29 DIAGNOSIS — Z202 Contact with and (suspected) exposure to infections with a predominantly sexual mode of transmission: Secondary | ICD-10-CM | POA: Insufficient documentation

## 2018-03-29 DIAGNOSIS — Z79899 Other long term (current) drug therapy: Secondary | ICD-10-CM | POA: Insufficient documentation

## 2018-03-29 LAB — URINALYSIS, ROUTINE W REFLEX MICROSCOPIC
Bilirubin Urine: NEGATIVE
GLUCOSE, UA: NEGATIVE mg/dL
HGB URINE DIPSTICK: NEGATIVE
Ketones, ur: NEGATIVE mg/dL
Leukocytes, UA: NEGATIVE
Nitrite: NEGATIVE
Protein, ur: NEGATIVE mg/dL
SPECIFIC GRAVITY, URINE: 1.014 (ref 1.005–1.030)
pH: 7 (ref 5.0–8.0)

## 2018-03-29 MED ORDER — STERILE WATER FOR INJECTION IJ SOLN
INTRAMUSCULAR | Status: AC
  Administered 2018-03-29: 10 mL
  Filled 2018-03-29: qty 10

## 2018-03-29 MED ORDER — AZITHROMYCIN 250 MG PO TABS
1000.0000 mg | ORAL_TABLET | Freq: Once | ORAL | Status: AC
  Administered 2018-03-29: 1000 mg via ORAL
  Filled 2018-03-29: qty 4

## 2018-03-29 MED ORDER — METRONIDAZOLE 500 MG PO TABS: 500.0000 mg | ORAL_TABLET | Freq: Two times a day (BID) | ORAL | 0 refills | Status: DC

## 2018-03-29 MED ORDER — CEFTRIAXONE SODIUM 250 MG IJ SOLR
250.0000 mg | Freq: Once | INTRAMUSCULAR | Status: AC
  Administered 2018-03-29: 250 mg via INTRAMUSCULAR
  Filled 2018-03-29: qty 250

## 2018-03-29 NOTE — Discharge Instructions (Addendum)
Follow up with the health department for additional STD screening.

## 2018-03-29 NOTE — ED Triage Notes (Signed)
Repots that his SO was diagnosed with Trich and he needing treatment.

## 2018-03-29 NOTE — ED Provider Notes (Signed)
[facility]-EMERGENCY DEPT Provider Note   CSN: [id number] Arrival date & time: 03/29/18  1601     History   Chief Complaint Chief Complaint  Patient presents with  . Exposure to STD    HPI Frank Rangel is a 32 y.o. male who presents to the ED for STD treatment. Patient reports that his significant other was diagnosed with trichomonas.   HPI  Past Medical History:  Diagnosis Date  . Migraines   . Pneumonia     There are no active problems to display for this patient.   History reviewed. No pertinent surgical history.      Home Medications    Prior to Admission medications   Medication Sig Start Date End Date Taking? Authorizing Provider  acetaminophen (TYLENOL) 325 MG tablet Take 650 mg by mouth every 6 (six) hours as needed for mild pain, moderate pain or fever.    [provider]  amLODipine (NORVASC) 10 MG tablet Take 10 mg by mouth daily.    [provider]  amoxicillin-clavulanate (AUGMENTIN) 875-125 MG tablet Take 1 tablet by mouth every 12 (twelve) hours. 03/20/18   Caccavale, Sophia, PA-C  fluticasone (FLONASE) 50 MCG/ACT nasal spray Place 1 spray into both nostrils daily. 03/20/18   Caccavale, Sophia, PA-C  loratadine (CLARITIN) 10 MG tablet Take 1 tablet (10 mg total) by mouth daily. 03/20/18   Caccavale, Sophia, PA-C  metroNIDAZOLE (FLAGYL) 500 MG tablet Take 1 tablet (500 mg total) by mouth 2 (two) times daily. 03/29/18   Janne Napoleon, NP  naproxen (NAPROSYN) 500 MG tablet Take 1 tablet (500 mg total) by mouth 2 (two) times daily with a meal. 03/20/18   Caccavale, Sophia, PA-C    Family History No family history on file.  Social History Social History   Tobacco Use  . Smoking status: Former Games developer  . Smokeless tobacco: Never Used  Substance Use Topics  . Alcohol use: Yes  . Drug use: No     Allergies   Reglan [metoclopramide]   Review of Systems Review of Systems  Genitourinary: Negative for discharge  and penile pain.  All other systems reviewed and are negative.    Physical Exam Updated Vital Signs BP (!) 145/108 (BP Location: Left Arm)   Pulse (!) 55   Temp 98.3 F (36.8 C) (Oral)   Resp 16   SpO2 100%   Physical Exam  Constitutional: He appears well-developed and well-nourished. No distress.  HENT:  Head: Normocephalic.  Eyes: EOM are normal.  Neck: Neck supple.  Cardiovascular: Bradycardia present.  Pulmonary/Chest: Effort normal.  Genitourinary:  Genitourinary Comments: Patient declined stating he has not symptoms just wants treatment.   Musculoskeletal: Normal range of motion.  Neurological: He is alert.  Skin: No rash noted.  Psychiatric: He has a normal mood and affect.  Nursing note and vitals reviewed.    ED Treatments / Results  Labs (all labs ordered are listed, but only abnormal results are displayed) Labs Reviewed  URINALYSIS, ROUTINE W REFLEX MICROSCOPIC  GC/CHLAMYDIA PROBE AMP ([place]) NOT AT [facility]  patient declined blood draw for HIV and RPR stating he recently had those done.  Radiology No results found.  Procedures Procedures (including critical care time)  Medications Ordered in ED Medications  cefTRIAXone (ROCEPHIN) injection 250 mg (250 mg Intramuscular Given 03/29/18 1708)  azithromycin (ZITHROMAX) tablet 1,000 mg (1,000 mg Oral Given 03/29/18 1708)  sterile water (preservative free) injection (10 mLs  Given 03/29/18 1709)  Initial Impression / Assessment and Plan / ED Course  I have reviewed the triage vital signs and the nursing notes. Pt presents with concerns for possible STD.  Pt understands that they have GC/Chlamydia cultures pending and that they will need to inform all sexual partners if results return positive. Pt has been treated prophylactically with azithromycin and Rocephin due to pts history. Patient treated with Flagyl due to partner with trichomonas infection.  Patient to be discharged with instructions to follow up  with [facility]. Discussed importance of using protection when sexually active.   Final Clinical Impressions(s) / ED Diagnoses   Final diagnoses:  STD exposure  Elevated blood pressure reading    ED Discharge Orders        Ordered    metroNIDAZOLE (FLAGYL) 500 MG tablet  2 times daily     03/29/18 [address] [place], [place] 03/29/18 Lennice Sites, MD 04/01/18 [phone]

## 2018-03-30 LAB — GC/CHLAMYDIA PROBE AMP (~~LOC~~) NOT AT ARMC
Chlamydia: NEGATIVE
NEISSERIA GONORRHEA: NEGATIVE

## 2021-08-19 ENCOUNTER — Encounter (HOSPITAL_COMMUNITY): Payer: Self-pay

## 2021-08-19 ENCOUNTER — Other Ambulatory Visit: Payer: Self-pay

## 2021-08-19 ENCOUNTER — Ambulatory Visit (HOSPITAL_COMMUNITY)
Admission: EM | Admit: 2021-08-19 | Discharge: 2021-08-19 | Disposition: A | Discharge status: Home / Self Care | Payer: Self-pay | Attending: Emergency Medicine | Admitting: Emergency Medicine

## 2021-08-19 DIAGNOSIS — K047 Periapical abscess without sinus: Secondary | ICD-10-CM

## 2021-08-19 DIAGNOSIS — S025XXA Fracture of tooth (traumatic), initial encounter for closed fracture: Secondary | ICD-10-CM

## 2021-08-19 MED ORDER — AMOXICILLIN-POT CLAVULANATE 875-125 MG PO TABS: 1.0000 | ORAL_TABLET | Freq: Two times a day (BID) | ORAL | 0 refills | Status: DC

## 2021-08-19 NOTE — ED Triage Notes (Signed)
Pt presents with dental pain. Pt states he has a tooth that has broken and has now had pain and swelling x3 days. Pt states he has been taking a friends penicillin 500mg  x3 days

## 2021-08-19 NOTE — ED Provider Notes (Signed)
[facility]    CSN: [id number] Arrival date & time: 08/19/21  1529      History   Chief Complaint Chief Complaint  Patient presents with   Dental Pain    HPI Frank Rangel is a 35 y.o. male.   Patient here for evaluation of dental swelling.  Reports having a cracked tooth on his right lower jaw and developed swelling several days ago.  Reports taking some penicillin for the past 2 days but reports symptoms have not improved.  Denies any specific pain.  Has not seen a dentist recently.  Reports using salt water and hydrogen peroxide with no symptom relief.  Denies any trauma, injury, or other precipitating event.  Denies any specific alleviating or aggravating factors.  Denies any fevers, chest pain, shortness of breath, N/V/D, numbness, tingling, weakness, abdominal pain, or headaches.    The history is provided by the patient.  Dental Pain  Past Medical History:  Diagnosis Date   Migraines    Pneumonia     There are no problems to display for this patient.   History reviewed. No pertinent surgical history.     Home Medications    Prior to Admission medications   Medication Sig Start Date End Date Taking? Authorizing Provider  amoxicillin-clavulanate (AUGMENTIN) 875-125 MG tablet Take 1 tablet by mouth every 12 (twelve) hours. 08/19/21  Yes Ivette Loyal, NP  acetaminophen (TYLENOL) 325 MG tablet Take 650 mg by mouth every 6 (six) hours as needed for mild pain, moderate pain or fever.    [provider]  amLODipine (NORVASC) 10 MG tablet Take 10 mg by mouth daily. Patient not taking: Reported on 08/19/2021    [provider]  fluticasone (FLONASE) 50 MCG/ACT nasal spray Place 1 spray into both nostrils daily. Patient not taking: Reported on 08/19/2021 03/20/18   Caccavale, Sophia, PA-C  loratadine (CLARITIN) 10 MG tablet Take 1 tablet (10 mg total) by mouth daily. Patient not taking: Reported on 08/19/2021 03/20/18   Caccavale, Sophia, PA-C   naproxen (NAPROSYN) 500 MG tablet Take 1 tablet (500 mg total) by mouth 2 (two) times daily with a meal. Patient not taking: Reported on 08/19/2021 03/20/18   Caccavale, Sophia, PA-C    Family History History reviewed. No pertinent family history.  Social History Social History   Tobacco Use   Smoking status: Former   Smokeless tobacco: Never  Building services engineer Use: Never used  Substance Use Topics   Alcohol use: Yes   Drug use: No     Allergies   Reglan [metoclopramide]   Review of Systems Review of Systems  HENT:  Positive for dental problem.   All other systems reviewed and are negative.   Physical Exam Triage Vital Signs ED Triage Vitals  Enc Vitals Group     BP 08/19/21 1558 (!) 168/82     Pulse Rate 08/19/21 1558 70     Resp 08/19/21 1558 12     Temp 08/19/21 1558 98.2 F (36.8 C)     Temp Source 08/19/21 1558 Oral     SpO2 08/19/21 1558 97 %     Weight --      Height --      Head Circumference --      Peak Flow --      Pain Score 08/19/21 1559 0     Pain Loc --      Pain Edu? --      Excl. in [place]? --  No data found.  Updated Vital Signs BP (!) 168/82 (BP Location: Right Arm)   Pulse 70   Temp 98.2 F (36.8 C) (Oral)   Resp 12   SpO2 97%   Visual Acuity Right Eye Distance:   Left Eye Distance:   Bilateral Distance:    Right Eye Near:   Left Eye Near:    Bilateral Near:     Physical Exam Vitals and nursing note reviewed.  Constitutional:      General: He is not in acute distress.    Appearance: Normal appearance. He is not ill-appearing, toxic-appearing or diaphoretic.  HENT:     Head: Normocephalic and atraumatic.     Mouth/Throat:     Dentition: Abnormal dentition. Gingival swelling and dental abscesses present.   Eyes:     Conjunctiva/sclera: Conjunctivae normal.  Cardiovascular:     Rate and Rhythm: Normal rate.     Pulses: Normal pulses.  Pulmonary:     Effort: Pulmonary effort is normal.  Abdominal:     General:  Abdomen is flat.  Musculoskeletal:        General: Normal range of motion.     Cervical back: Normal range of motion.  Skin:    General: Skin is warm and dry.  Neurological:     General: No focal deficit present.     Mental Status: He is alert and oriented to person, place, and time.  Psychiatric:        Mood and Affect: Mood normal.     UC Treatments / Results  Labs (all labs ordered are listed, but only abnormal results are displayed) Labs Reviewed - No data to display  EKG   Radiology No results found.  Procedures Procedures (including critical care time)  Medications Ordered in UC Medications - No data to display  Initial Impression / Assessment and Plan / UC Course  I have reviewed the triage vital signs and the nursing notes.  Pertinent labs & imaging results that were available during my care of the patient were reviewed by me and considered in my medical decision making (see chart for details).    Assessment negative for red flags or concerns.  Dental abscess and broken tooth.  We will treat with Augmentin twice daily for the next 7 days.  May use Tylenol and/or ibuprofen as needed.  Recommend a soft diet and good oral hygiene.  Follow-up with dentist soon as possible and dental resource sheet given to patient.  Strict ED follow-up for any worsening symptoms. Final Clinical Impressions(s) / UC Diagnoses   Final diagnoses:  Dental abscess  Closed fracture of tooth, initial encounter     Discharge Instructions      Take the Augmentin 1 pill twice a day for the next 7 days.   You can use Ibuprofen and/or Tylenol as needed for pain relief and fever reduction.   Try to maintain a soft diet and good oral hygiene care until evaluated by dentist  Follow up with dentist as soon as possible for further evaluation and treatment   Return or go to the ED if you have any new or worsening symptoms such as fever, chills, difficulty swallowing, painful swallowing, oral  or neck swelling, nausea, vomiting, chest pain, SOB.     ED Prescriptions     Medication Sig Dispense Auth. Provider   amoxicillin-clavulanate (AUGMENTIN) 875-125 MG tablet Take 1 tablet by mouth every 12 (twelve) hours. 14 tablet Ivette Loyal, NP      PDMP  not reviewed this encounter.   Ivette Loyal, NP 08/19/21 [phone]

## 2021-08-19 NOTE — Discharge Instructions (Signed)
Take the Augmentin 1 pill twice a day for the next 7 days.   You can use Ibuprofen and/or Tylenol as needed for pain relief and fever reduction.   Try to maintain a soft diet and good oral hygiene care until evaluated by dentist  Follow up with dentist as soon as possible for further evaluation and treatment   Return or go to the ED if you have any new or worsening symptoms such as fever, chills, difficulty swallowing, painful swallowing, oral or neck swelling, nausea, vomiting, chest pain, SOB.

## 2021-11-24 ENCOUNTER — Encounter (HOSPITAL_COMMUNITY): Payer: Self-pay

## 2021-11-24 ENCOUNTER — Ambulatory Visit (HOSPITAL_COMMUNITY)
Admission: EM | Admit: 2021-11-24 | Discharge: 2021-11-24 | Disposition: A | Discharge status: Home / Self Care | Payer: Self-pay | Attending: Emergency Medicine | Admitting: Emergency Medicine

## 2021-11-24 DIAGNOSIS — R300 Dysuria: Secondary | ICD-10-CM | POA: Insufficient documentation

## 2021-11-24 DIAGNOSIS — A630 Anogenital (venereal) warts: Secondary | ICD-10-CM | POA: Insufficient documentation

## 2021-11-24 LAB — POCT URINALYSIS DIPSTICK, ED / UC
Bilirubin Urine: NEGATIVE
Glucose, UA: NEGATIVE mg/dL
Hgb urine dipstick: NEGATIVE
Ketones, ur: NEGATIVE mg/dL
Leukocytes,Ua: NEGATIVE
Nitrite: NEGATIVE
Protein, ur: NEGATIVE mg/dL
Specific Gravity, Urine: 1.025 (ref 1.005–1.030)
Urobilinogen, UA: 0.2 mg/dL (ref 0.0–1.0)
pH: 6 (ref 5.0–8.0)

## 2021-11-24 MED ORDER — CEFTRIAXONE SODIUM 500 MG IJ SOLR
INTRAMUSCULAR | Status: AC
  Filled 2021-11-24: qty 500

## 2021-11-24 MED ORDER — LIDOCAINE HCL (PF) 1 % IJ SOLN
INTRAMUSCULAR | Status: AC
  Filled 2021-11-24: qty 2

## 2021-11-24 MED ORDER — CEFTRIAXONE SODIUM 500 MG IJ SOLR
500.0000 mg | Freq: Once | INTRAMUSCULAR | Status: AC
  Administered 2021-11-24: 500 mg via INTRAMUSCULAR

## 2021-11-24 NOTE — ED Provider Notes (Signed)
[facility]    CSN: [id number] Arrival date & time: 11/24/21  [id number]      History   Chief Complaint Chief Complaint  Patient presents with   Exposure to STD    HPI Frank Rangel is a 36 y.o. male.   Patient presents with dysuria for 2 days.  Known exposure to gonorrhea.  Sexually active, 1 partner, sometimes condom use.  Denies penile discharge, penile or testicle swelling, urinary frequency or urgency, hematuria.  Endorses a small lesion on his penis present for greater than 1 week.  Site does not drain is not painful or tender.  Possible changes in size.  Initially thought it was an ingrown hair in which she removed hair from site but no improvement seen.   Past Medical History:  Diagnosis Date   Migraines    Pneumonia     There are no problems to display for this patient.   History reviewed. No pertinent surgical history.     Home Medications    Prior to Admission medications   Medication Sig Start Date End Date Taking? Authorizing Provider  acetaminophen (TYLENOL) 325 MG tablet Take 650 mg by mouth every 6 (six) hours as needed for mild pain, moderate pain or fever.    [provider]  amLODipine (NORVASC) 10 MG tablet Take 10 mg by mouth daily. Patient not taking: Reported on 08/19/2021    [provider]  amoxicillin-clavulanate (AUGMENTIN) 875-125 MG tablet Take 1 tablet by mouth every 12 (twelve) hours. Patient not taking: Reported on 11/24/2021 08/19/21   Pearson Forster, NP  fluticasone [facility]) 50 MCG/ACT nasal spray Place 1 spray into both nostrils daily. Patient not taking: Reported on 08/19/2021 03/20/18   Caccavale, Sophia, PA-C  loratadine (CLARITIN) 10 MG tablet Take 1 tablet (10 mg total) by mouth daily. Patient not taking: Reported on 08/19/2021 03/20/18   Caccavale, Sophia, PA-C  naproxen (NAPROSYN) 500 MG tablet Take 1 tablet (500 mg total) by mouth 2 (two) times daily with a meal. Patient not taking: Reported on 08/19/2021  03/20/18   Caccavale, Sophia, PA-C    Family History History reviewed. No pertinent family history.  Social History Social History   Tobacco Use   Smoking status: Former   Smokeless tobacco: Never  Scientific laboratory technician Use: Never used  Substance Use Topics   Alcohol use: Yes   Drug use: No     Allergies   Reglan [metoclopramide]   Review of Systems Review of Systems  Constitutional: Negative.   Respiratory: Negative.    Genitourinary:  Positive for dysuria and genital sores. Negative for decreased urine volume, difficulty urinating, enuresis, flank pain, frequency, hematuria, penile discharge, penile pain, penile swelling, scrotal swelling, testicular pain and urgency.  Skin: Negative.     Physical Exam Triage Vital Signs ED Triage Vitals  Enc Vitals Group     BP 11/24/21 0957 (!) 149/97     Pulse Rate 11/24/21 0957 66     Resp 11/24/21 0957 12     Temp 11/24/21 0957 98.2 F (36.8 C)     Temp Source 11/24/21 0957 Oral     SpO2 11/24/21 0957 100 %     Weight 11/24/21 1000 195 lb (88.5 kg)     Height --      Head Circumference --      Peak Flow --      Pain Score 11/24/21 0959 0     Pain Loc --  Pain Edu? --      Excl. in [place]? --    No data found.  Updated Vital Signs BP (!) 149/97 (BP Location: Left Arm)    Pulse 66    Temp 98.2 F (36.8 C) (Oral)    Resp 12    Wt 195 lb (88.5 kg)    SpO2 100%    BMI 29.65 kg/m   Visual Acuity Right Eye Distance:   Left Eye Distance:   Bilateral Distance:    Right Eye Near:   Left Eye Near:    Bilateral Near:     Physical Exam Constitutional:      Appearance: Normal appearance.  HENT:     Head: Normocephalic.  Eyes:     Extraocular Movements: Extraocular movements intact.  Pulmonary:     Effort: Pulmonary effort is normal.  Genitourinary:      Comments: Less than 0.5 cm genital wart present on the anterior shaft of penis, no anatomical abnormalities, no tenderness,, discharge or erythema noted Skin:     General: Skin is warm and dry.  Neurological:     Mental Status: He is alert and oriented to person, place, and time. Mental status is at baseline.  Psychiatric:        Mood and Affect: Mood normal.        Behavior: Behavior normal.     UC Treatments / Results  Labs (all labs ordered are listed, but only abnormal results are displayed) Labs Reviewed  CYTOLOGY, (ORAL, ANAL, URETHRAL) ANCILLARY ONLY    EKG   Radiology No results found.  Procedures Procedures (including critical care time)  Medications Ordered in UC Medications - No data to display  Initial Impression / Assessment and Plan / UC Course  I have reviewed the triage vital signs and the nursing notes.  Pertinent labs & imaging results that were available during my care of the patient were reviewed by me and considered in my medical decision making (see chart for details).  Genital wart Dysuria  We will treat prophylactically for gonorrhea, ceftriaxone injection given today in office, advised abstinence for the next 7 days, STI labs pending, will treat per protocol for remaining STI infections, advised abstinence until all lab results and symptoms have resolved, due to financial strain of treatment of genital warts, advised patient to go to local health department for evaluation and treatment as medication is free , UC follow up as needed Final Clinical Impressions(s) / UC Diagnoses   Final diagnoses:  None   Discharge Instructions   None    ED Prescriptions   None    PDMP not reviewed this encounter.   Hans Eden, NP 11/24/21 1040

## 2021-11-24 NOTE — ED Triage Notes (Signed)
Pt presents with dysuria x 2 days and a bump on the base of his penis. Pt has known STI exposure to chlamydia

## 2021-11-24 NOTE — Discharge Instructions (Signed)
For treatment of your genital wart please go to the local health department so that you can receive the medication for free  Today you are being treated for gonorrhea, please refrain from having sex for the next 7 days  Labs pending 2-3 days, you will be contacted if positive for any sti and treatment will be sent to the pharmacy, you will have to return to the clinic if positive for gonorrhea to receive treatment   Please refrain from having sex until labs results, if positive please refrain from having sex until treatment complete and symptoms resolve   If positive for Chlamydia  gonorrhea or trichomoniasis please notify partner or partners so they may tested as well  Moving forward, it is recommended you use some form of protection against the transmission of sti infections  such as condoms or dental dams with each sexual encounter

## 2021-11-25 LAB — CYTOLOGY, (ORAL, ANAL, URETHRAL) ANCILLARY ONLY
Chlamydia: NEGATIVE
Comment: NEGATIVE
Comment: NEGATIVE
Comment: NORMAL
Neisseria Gonorrhea: NEGATIVE
Trichomonas: NEGATIVE

## 2022-10-06 ENCOUNTER — Ambulatory Visit (HOSPITAL_COMMUNITY)
Admission: EM | Admit: 2022-10-06 | Discharge: 2022-10-06 | Disposition: A | Discharge status: Home / Self Care | Payer: Self-pay | Attending: Physician Assistant | Admitting: Physician Assistant

## 2022-10-06 ENCOUNTER — Encounter (HOSPITAL_COMMUNITY): Payer: Self-pay

## 2022-10-06 DIAGNOSIS — Z202 Contact with and (suspected) exposure to infections with a predominantly sexual mode of transmission: Secondary | ICD-10-CM

## 2022-10-06 DIAGNOSIS — N342 Other urethritis: Secondary | ICD-10-CM

## 2022-10-06 MED ORDER — DOXYCYCLINE HYCLATE 100 MG PO CAPS: 100.0000 mg | ORAL_CAPSULE | Freq: Two times a day (BID) | ORAL | 0 refills | Status: AC

## 2022-10-06 MED ORDER — LIDOCAINE HCL (PF) 1 % IJ SOLN
INTRAMUSCULAR | Status: AC
  Filled 2022-10-06: qty 2

## 2022-10-06 MED ORDER — DOXYCYCLINE HYCLATE 100 MG PO CAPS: 100.0000 mg | ORAL_CAPSULE | Freq: Two times a day (BID) | ORAL | 0 refills | Status: DC

## 2022-10-06 MED ORDER — CEFTRIAXONE SODIUM 500 MG IJ SOLR
500.0000 mg | INTRAMUSCULAR | Status: DC
  Administered 2022-10-06: 500 mg via INTRAMUSCULAR

## 2022-10-06 MED ORDER — CEFTRIAXONE SODIUM 500 MG IJ SOLR
INTRAMUSCULAR | Status: AC
  Filled 2022-10-06: qty 500

## 2022-10-06 NOTE — ED Triage Notes (Signed)
Patient states his partner is positive for chlamydia. Patient wanting testing.   Patient states he is having some penile discharge. Onset yesterday.

## 2022-10-06 NOTE — Discharge Instructions (Addendum)
We are treating you for gonorrhea and chlamydia since you are having symptoms.  We will contact you if you are positive for trichomonas and we need to arrange additional treatment.  Please use a condom with each sexual encounter.  You should abstain from sex for minimum of 7 days after completing course of medication (minimum of 2 weeks).  If anything worsens you need to be seen immediately.

## 2022-10-06 NOTE — ED Provider Notes (Signed)
[facility]    CSN: [id number] Arrival date & time: 10/06/22  1191      History   Chief Complaint Chief Complaint  Patient presents with   Exposure to STD    HPI Frank Rangel is a 36 y.o. male.   Patient presents today with a 1 day history of penile discharge.  Reports that he was contacted approximately a week ago by previous partner who reported that they tested positive for chlamydia.  Last sexual encounter with this individual was 1 week ago.  He does have a history of STI but reports completing treatment having resolution of symptoms.  He reports an unusual sensation in his urethra but denies any dysuria.  He denies any genital lesions.  Reports that he typically has blood testing for HIV/syphilis/hepatitis with health department every 6 months and so declined this testing today.  He has anxious to receive treatment given his symptoms and he does not want anything to worsen.  Denies any recent antibiotics.     Past Medical History:  Diagnosis Date   Migraines    Pneumonia     There are no problems to display for this patient.   History reviewed. No pertinent surgical history.     Home Medications    Prior to Admission medications   Medication Sig Start Date End Date Taking? Authorizing Provider  acetaminophen (TYLENOL) 325 MG tablet Take 650 mg by mouth every 6 (six) hours as needed for mild pain, moderate pain or fever.    [provider]  amLODipine (NORVASC) 10 MG tablet Take 10 mg by mouth daily. Patient not taking: Reported on 08/19/2021    [provider]  doxycycline (VIBRAMYCIN) 100 MG capsule Take 1 capsule (100 mg total) by mouth 2 (two) times daily. 10/06/22   Jaquil Todt, Denny Peon K, PA-C  fluticasone (FLONASE) 50 MCG/ACT nasal spray Place 1 spray into both nostrils daily. Patient not taking: Reported on 08/19/2021 03/20/18   Caccavale, Sophia, PA-C  loratadine (CLARITIN) 10 MG tablet Take 1 tablet (10 mg total) by mouth  daily. Patient not taking: Reported on 08/19/2021 03/20/18   Caccavale, Sophia, PA-C  naproxen (NAPROSYN) 500 MG tablet Take 1 tablet (500 mg total) by mouth 2 (two) times daily with a meal. Patient not taking: Reported on 08/19/2021 03/20/18   Caccavale, Sophia, PA-C    Family History History reviewed. No pertinent family history.  Social History Social History   Tobacco Use   Smoking status: Former   Smokeless tobacco: Never  Building services engineer Use: Never used  Substance Use Topics   Alcohol use: Yes   Drug use: No     Allergies   Reglan [metoclopramide]   Review of Systems Review of Systems  Constitutional:  Negative for activity change, appetite change, fatigue and fever.  Gastrointestinal:  Negative for abdominal pain, diarrhea, nausea and vomiting.  Genitourinary:  Positive for penile discharge and penile pain. Negative for dysuria, frequency, genital sores, hematuria and urgency.     Physical Exam Triage Vital Signs ED Triage Vitals  Enc Vitals Group     BP 10/06/22 0839 (!) 155/100     Pulse Rate 10/06/22 0839 62     Resp 10/06/22 0839 16     Temp 10/06/22 0839 (!) 97.5 F (36.4 C)     Temp Source 10/06/22 0839 Oral     SpO2 10/06/22 0839 100 %     Weight --      Height --  Head Circumference --      Peak Flow --      Pain Score 10/06/22 0837 0     Pain Loc --      Pain Edu? --      Excl. in GC? --    No data found.  Updated Vital Signs BP (!) 155/100 (BP Location: Left Arm)   Pulse 62   Temp (!) 97.5 F (36.4 C) (Oral)   Resp 16   SpO2 100%   Visual Acuity Right Eye Distance:   Left Eye Distance:   Bilateral Distance:    Right Eye Near:   Left Eye Near:    Bilateral Near:     Physical Exam Vitals reviewed. Exam conducted with a chaperone present.  Constitutional:      General: He is awake.     Appearance: Normal appearance. He is well-developed. He is not ill-appearing.     Comments: Very pleasant male appears stated age in no  acute distress sitting comfortably in exam room  HENT:     Head: Normocephalic and atraumatic.     Mouth/Throat:     Pharynx: No oropharyngeal exudate, posterior oropharyngeal erythema or uvula swelling.  Cardiovascular:     Rate and Rhythm: Normal rate and regular rhythm.     Heart sounds: Normal heart sounds, S1 normal and S2 normal. No murmur heard. Pulmonary:     Effort: Pulmonary effort is normal.     Breath sounds: Normal breath sounds. No stridor. No wheezing, rhonchi or rales.     Comments: Clear to auscultation bilaterally Abdominal:     General: Bowel sounds are normal.     Palpations: Abdomen is soft.     Tenderness: There is no abdominal tenderness. There is no right CVA tenderness, left CVA tenderness, guarding or rebound.  Genitourinary:    Penis: Circumcised. Discharge present. No lesions.   Neurological:     Mental Status: He is alert.  Psychiatric:        Behavior: Behavior is cooperative.      UC Treatments / Results  Labs (all labs ordered are listed, but only abnormal results are displayed) Labs Reviewed  CYTOLOGY, (ORAL, ANAL, URETHRAL) ANCILLARY ONLY    EKG   Radiology No results found.  Procedures Procedures (including critical care time)  Medications Ordered in UC Medications  cefTRIAXone (ROCEPHIN) injection 500 mg (has no administration in time range)    Initial Impression / Assessment and Plan / UC Course  I have reviewed the triage vital signs and the nursing notes.  Pertinent labs & imaging results that were available during my care of the patient were reviewed by me and considered in my medical decision making (see chart for details).     Discharge noted on exam.  STI swab collected today.  Patient declined HIV/hepatitis/syphilis testing.  He was given Rocephin and started on doxycycline for urethritis.  Discussed that he needs to abstain from sex from a minimum of 7 days after completing course of treatment (a minimum of 2 weeks).   Discussed importance of safe sex practices.  If develops any additional or worsening symptoms he is to return for reevaluation.  All questions answered to patient satisfaction.  Final Clinical Impressions(s) / UC Diagnoses   Final diagnoses:  Urethritis  Exposure to STD  Exposure to chlamydia     Discharge Instructions      We are treating you for gonorrhea and chlamydia since you are having symptoms.  We will contact you if  you are positive for trichomonas and we need to arrange additional treatment.  Please use a condom with each sexual encounter.  You should abstain from sex for minimum of 7 days after completing course of medication (minimum of 2 weeks).  If anything worsens you need to be seen immediately.    ED Prescriptions     Medication Sig Dispense Auth. Provider   doxycycline (VIBRAMYCIN) 100 MG capsule  (Status: Discontinued) Take 1 capsule (100 mg total) by mouth 2 (two) times daily. 14 capsule Jamariyah Johannsen K, PA-C   doxycycline (VIBRAMYCIN) 100 MG capsule Take 1 capsule (100 mg total) by mouth 2 (two) times daily. 14 capsule Sharleen Szczesny K, PA-C      PDMP not reviewed this encounter.   Jeani Hawking, PA-C 10/06/22 1460

## 2022-10-07 LAB — CYTOLOGY, (ORAL, ANAL, URETHRAL) ANCILLARY ONLY
Chlamydia: NEGATIVE
Comment: NEGATIVE
Comment: NEGATIVE
Comment: NORMAL
Neisseria Gonorrhea: NEGATIVE
Trichomonas: NEGATIVE
